# Patient Record
Sex: Male | Born: 1985 | Race: Black or African American | Hispanic: No | Marital: Married | Smoking: Never smoker
Health system: Southern US, Community
[De-identification: ages and names within clinical notes are randomized; demographics above are authoritative.]

## PROBLEM LIST (undated history)

## (undated) DIAGNOSIS — R569 Unspecified convulsions: Secondary | ICD-10-CM

## (undated) DIAGNOSIS — I10 Essential (primary) hypertension: Secondary | ICD-10-CM

## (undated) DIAGNOSIS — K219 Gastro-esophageal reflux disease without esophagitis: Secondary | ICD-10-CM

## (undated) DIAGNOSIS — F419 Anxiety disorder, unspecified: Secondary | ICD-10-CM

## (undated) HISTORY — PX: COLONOSCOPY: SHX174

## (undated) HISTORY — PX: WISDOM TOOTH EXTRACTION: SHX21

## (undated) HISTORY — PX: NO PAST SURGERIES: SHX2092

## (undated) HISTORY — DX: Anxiety disorder, unspecified: F41.9

## (undated) HISTORY — DX: Gastro-esophageal reflux disease without esophagitis: K21.9

---

## 2009-08-06 ENCOUNTER — Emergency Department (HOSPITAL_COMMUNITY): Admission: EM | Admit: 2009-08-06 | Discharge: 2009-08-06 | Payer: Self-pay | Admitting: Emergency Medicine

## 2015-07-13 ENCOUNTER — Emergency Department (HOSPITAL_COMMUNITY)
Admission: EM | Admit: 2015-07-13 | Discharge: 2015-07-13 | Disposition: A | Discharge status: Home / Self Care | Payer: 59 | Attending: Emergency Medicine | Admitting: Emergency Medicine

## 2015-07-13 ENCOUNTER — Encounter (HOSPITAL_COMMUNITY): Payer: Self-pay | Admitting: Cardiology

## 2015-07-13 DIAGNOSIS — Z79899 Other long term (current) drug therapy: Secondary | ICD-10-CM | POA: Insufficient documentation

## 2015-07-13 DIAGNOSIS — I10 Essential (primary) hypertension: Secondary | ICD-10-CM | POA: Insufficient documentation

## 2015-07-13 HISTORY — DX: Unspecified convulsions: R56.9

## 2015-07-13 LAB — BASIC METABOLIC PANEL
Anion gap: 7 (ref 5–15)
BUN: 14 mg/dL (ref 6–20)
CALCIUM: 9.8 mg/dL (ref 8.9–10.3)
CO2: 28 mmol/L (ref 22–32)
CREATININE: 1.1 mg/dL (ref 0.61–1.24)
Chloride: 103 mmol/L (ref 101–111)
GFR calc Af Amer: >60 mL/min (ref >60)
GLUCOSE: 89 mg/dL (ref 65–99)
Potassium: 4.4 mmol/L (ref 3.5–5.1)
Sodium: 138 mmol/L (ref 135–145)

## 2015-07-13 LAB — URINALYSIS, ROUTINE W REFLEX MICROSCOPIC
Bilirubin Urine: NEGATIVE
Glucose, UA: NEGATIVE mg/dL
Hgb urine dipstick: NEGATIVE
Ketones, ur: NEGATIVE mg/dL
Leukocytes, UA: NEGATIVE
Nitrite: NEGATIVE
Protein, ur: NEGATIVE mg/dL
Specific Gravity, Urine: 1.008 (ref 1.005–1.030)
Urobilinogen, UA: 1 mg/dL (ref 0.0–1.0)
pH: 6.5 (ref 5.0–8.0)

## 2015-07-13 LAB — CBC
HCT: 41.5 % (ref 39.0–52.0)
Hemoglobin: 13.3 g/dL (ref 13.0–17.0)
MCH: 27.4 pg (ref 26.0–34.0)
MCHC: 32 g/dL (ref 30.0–36.0)
MCV: 85.6 fL (ref 78.0–100.0)
Platelets: 226 10*3/uL (ref 150–400)
RBC: 4.85 MIL/uL (ref 4.22–5.81)
RDW: 12.9 % (ref 11.5–15.5)
WBC: 4.5 10*3/uL (ref 4.0–10.5)

## 2015-07-13 LAB — I-STAT TROPONIN, ED: TROPONIN I, POC: 0 ng/mL (ref 0.00–0.08)

## 2015-07-13 MED ORDER — HYDRALAZINE HCL 20 MG/ML IJ SOLN
5.0000 mg | Freq: Once | INTRAMUSCULAR | Status: AC
  Administered 2015-07-13: 5 mg via INTRAVENOUS
  Filled 2015-07-13: qty 1

## 2015-07-13 NOTE — Discharge Instructions (Signed)
Hypertension Hypertension, commonly called high blood pressure, is when the force of blood pumping through your arteries is too strong. Your arteries are the blood vessels that carry blood from your heart throughout your body. A blood pressure reading consists of a higher number over a lower number, such as 110/72. The higher number (systolic) is the pressure inside your arteries when your heart pumps. The lower number (diastolic) is the pressure inside your arteries when your heart relaxes. Ideally you want your blood pressure below 120/80. Hypertension forces your heart to work harder to pump blood. Your arteries may become narrow or stiff. Having untreated or uncontrolled hypertension can cause heart attack, stroke, kidney disease, and other problems. RISK FACTORS Some risk factors for high blood pressure are controllable. Others are not.  Risk factors you cannot control include:   Race. You may be at higher risk if you are African American.  Age. Risk increases with age.  Gender. Men are at higher risk than women before age 45 years. After age 65, women are at higher risk than men. Risk factors you can control include:  Not getting enough exercise or physical activity.  Being overweight.  Getting too much fat, sugar, calories, or salt in your diet.  Drinking too much alcohol. SIGNS AND SYMPTOMS Hypertension does not usually cause signs or symptoms. Extremely high blood pressure (hypertensive crisis) may cause headache, anxiety, shortness of breath, and nosebleed. DIAGNOSIS To check if you have hypertension, your health care provider will measure your blood pressure while you are seated, with your arm held at the level of your heart. It should be measured at least twice using the same arm. Certain conditions can cause a difference in blood pressure between your right and left arms. A blood pressure reading that is higher than normal on one occasion does not mean that you need treatment. If  it is not clear whether you have high blood pressure, you may be asked to return on a different day to have your blood pressure checked again. Or, you may be asked to monitor your blood pressure at home for 1 or more weeks. TREATMENT Treating high blood pressure includes making lifestyle changes and possibly taking medicine. Living a healthy lifestyle can help lower high blood pressure. You may need to change some of your habits. Lifestyle changes may include:  Following the DASH diet. This diet is high in fruits, vegetables, and whole grains. It is low in salt, red meat, and added sugars.  Keep your sodium intake below 2,300 mg per day.  Getting at least 30-45 minutes of aerobic exercise at least 4 times per week.  Losing weight if necessary.  Not smoking.  Limiting alcoholic beverages.  Learning ways to reduce stress. Your health care provider may prescribe medicine if lifestyle changes are not enough to get your blood pressure under control, and if one of the following is true:  You are 18-59 years of age and your systolic blood pressure is above 140.  You are 60 years of age or older, and your systolic blood pressure is above 150.  Your diastolic blood pressure is above 90.  You have diabetes, and your systolic blood pressure is over 140 or your diastolic blood pressure is over 90.  You have kidney disease and your blood pressure is above 140/90.  You have heart disease and your blood pressure is above 140/90. Your personal target blood pressure may vary depending on your medical conditions, your age, and other factors. HOME CARE INSTRUCTIONS    Have your blood pressure rechecked as directed by your health care provider.   Take medicines only as directed by your health care provider. Follow the directions carefully. Blood pressure medicines must be taken as prescribed. The medicine does not work as well when you skip doses. Skipping doses also puts you at risk for  problems.  Do not smoke.   Monitor your blood pressure at home as directed by your health care provider. SEEK MEDICAL CARE IF:   You think you are having a reaction to medicines taken.  You have recurrent headaches or feel dizzy.  You have swelling in your ankles.  You have trouble with your vision. SEEK IMMEDIATE MEDICAL CARE IF:  You develop a severe headache or confusion.  You have unusual weakness, numbness, or feel faint.  You have severe chest or abdominal pain.  You vomit repeatedly.  You have trouble breathing. MAKE SURE YOU:   Understand these instructions.  Will watch your condition.  Will get help right away if you are not doing well or get worse.   This information is not intended to replace advice given to you by your health care provider. Make sure you discuss any questions you have with your health care provider.   Document Released: 09/08/2005 Document Revised: 01/23/2015 Document Reviewed: 07/01/2013 Elsevier Interactive Patient Education 2016 [employer]  Heart Disease Prevention Heart disease is a leading cause of death. There are many things you can do to help prevent heart disease. BE PHYSICALLY ACTIVE Physical activity is good for your heart. It helps control your blood pressure, cholesterol levels, and weight. Try to be physically active every day. Ask your health care provider what activities are best for you.  BE A HEALTHY WEIGHT Extra weight can strain your heart and affect your blood pressure and cholesterol levels. Lose weight with diet and exercise if recommended by your health care provider. EAT HEART-HEALTHY FOODS Follow a healthy eating plan as recommended by your health care provider or dietitian. Heart-healthy foods include:   High-fiber foods. These include oat bran, oatmeal, and whole-grain breads and cereals.  Fruits and vegetables. Avoid:  Alcohol.  Fried foods.  Foods high in saturated fat. These include meats,  butter, whole dairy products, shortening, and coconut or palm oil.  Salty foods. These include canned food, luncheon meat, salty snacks, and fast food. KEEP YOUR CHOLESTEROL LEVELS UNDER CONTROL Cholesterol is a substance that is used for many important functions. When your cholesterol levels are high, cholesterol can stick to the insides of your blood vessels, making them narrow or clog. This can lead to chest pain (angina) and a heart attack.  Keep your cholesterol levels under control as recommended by your health care provider. Have your cholesterol checked at least once a year. Target cholesterol levels (in mg/dL) for most people are:   Total cholesterol below 200.  LDL cholesterol below 100.  HDL cholesterol above 40 in men and above 50 in women.  Triglycerides below 150. KEEP YOUR BLOOD PRESSURE UNDER CONTROL Having high blood pressure (hypertension) puts you at risk for stroke and other forms of heart disease. Keep your blood pressure under control as recommended by your health care provider. Ask your health care provider if you need treatment to lower your blood pressure. If you are 58-59 years of age, have your blood pressure checked every 3-5 years. If you are 10 years of age or older, have your blood pressure checked every year. DO NOT USE TOBACCO PRODUCTS Tobacco smoke can damage  your heart and blood vessels. Do not use any tobacco products including cigarettes, chewing tobacco, or electronic cigarettes. If you need help quitting, ask your health care provider. TAKE MEDICINES AS DIRECTED Take medicines only as directed by your health care provider. Ask your health care provider whether you should take an aspirin every day. Taking aspirin can help reduce your risk of heart disease and stroke.  FOR MORE INFORMATION  To find out more about heart disease, visit the American Heart Association's website at www.americanheart.org   This information is not intended to replace advice given  to you by your health care provider. Make sure you discuss any questions you have with your health care provider.  Follow up with your primary care provider in one week for reevaluation of high blood pressure. Take blood pressure medications as prescribed. Return to the emergency department if you experience worsening of your symptoms, headache, difficulty urinating, blurry vision.

## 2015-07-13 NOTE — ED Notes (Signed)
Reports he started to feel "funny" at work and [facility]. Reports a tightness in his jaw.

## 2015-07-14 NOTE — ED Provider Notes (Signed)
CSN: [id number]     Arrival date & time 07/13/15  1316 History   First MD Initiated Contact with Patient 07/13/15 1449     Chief Complaint  Patient presents with  . Hypertension     (Consider location/radiation/quality/duration/timing/severity/associated sxs/prior Treatment) HPI   Harold Ayers is a 29 y.o M with a pmhx of HTN who presents to the emergency department today to be evaluated for high blood pressure. Patient states that earlier today she experienced a headache and tightness in history also would see the urgent care. While he was at urgent care his blood pressure was elevated "over 200". Patient states he was given a dose of blood pressure medication while he was there and then sent to the emergency department for further evaluation. Patient states that he has a history of high blood pressure for which he took medication for several years ago. Patient states at that time he weighed about 230 pounds. Patient lost 50 pounds and his blood pressure normalized so he stopped taking his blood pressure medication. However patient has since gained the weight back and is currently not on medication. In the emergency department, upon presentation patient's blood pressure is 183/127. Denies blurry vision, vomiting, dysuria, back pain, abdominal pain, chest pain, shortness of breath.   Past Medical History  Diagnosis Date  . Seizures (HCC)    History reviewed. No pertinent past surgical history. History reviewed. No pertinent family history. Social History  Substance Use Topics  . Smoking status: Never Smoker   . Smokeless tobacco: None  . Alcohol Use: Yes    Review of Systems  All other systems reviewed and are negative.     Allergies  Review of patient's allergies indicates no known allergies.  Home Medications   Prior to Admission medications   Medication Sig Start Date End Date Taking? Authorizing Provider  lisinopril-hydrochlorothiazide (PRINZIDE,ZESTORETIC) 10-12.5 MG  tablet Take 1 tablet by mouth daily.   Yes Historical Provider, MD   BP 168/105 mmHg  Pulse 77  Temp(Src) 98.6 F (37 C) (Oral)  Resp 18  Ht 6\' 3"  (1.905 m)  Wt 222 lb 8 oz (100.925 kg)  BMI 27.81 kg/m2  SpO2 100% Physical Exam  Constitutional: He is oriented to person, place, and time. He appears well-developed and well-nourished. No distress.  HENT:  Head: Normocephalic and atraumatic.  Mouth/Throat: Oropharynx is clear and moist. No oropharyngeal exudate.  No trismus.  Eyes: Conjunctivae and EOM are normal. Pupils are equal, round, and reactive to light. Right eye exhibits no discharge. Left eye exhibits no discharge. No scleral icterus.  Neck: Normal range of motion. Neck supple.  Cardiovascular: Normal rate, regular rhythm, normal heart sounds and intact distal pulses.  Exam reveals no gallop and no friction rub.   No murmur heard. Pulmonary/Chest: Effort normal and breath sounds normal. No respiratory distress. He has no wheezes. He has no rales. He exhibits no tenderness.  Abdominal: Soft. Bowel sounds are normal. He exhibits no distension and no mass. There is no tenderness. There is no rebound and no guarding.  Musculoskeletal: Normal range of motion. He exhibits no edema.  Lymphadenopathy:    He has no cervical adenopathy.  Neurological: He is alert and oriented to person, place, and time.  Strength 5/5 throughout. No sensory deficits.  NO gait abnormality  Skin: Skin is warm and dry. No rash noted. He is not diaphoretic. No erythema. No pallor.  Psychiatric: He has a normal mood and affect. His behavior is normal.  Nursing note  and vitals reviewed.   ED Course  Procedures (including critical care time) Labs Review Labs Reviewed  BASIC METABOLIC PANEL  CBC  URINALYSIS, ROUTINE W REFLEX MICROSCOPIC (NOT AT [facility])  I-STAT TROPOININ, ED    Imaging Review No results found. I have personally reviewed and evaluated these images and lab results as part of my medical  decision-making.   EKG Interpretation   Date/Time:  Friday July 13 2015 13:27:46 EDT Ventricular Rate:  82 PR Interval:  148 QRS Duration: 76 QT Interval:  378 QTC Calculation: 441 R Axis:   65 Text Interpretation:  Normal sinus rhythm Biatrial enlargement Pulmonary  disease pattern Abnormal ECG Confirmed by ALLEN  MD, ANTHONY ([id number]) on  07/13/2015 2:50:32 PM      MDM   Final diagnoses:  Essential hypertension    Patient with history of hypertension, currently not on blood pressure medication presents to the emergency room from urgent care to be evaluated for elevated blood pressure. Patient was given dose of unknown blood pressure medication while at urgent care. Patient complaining of headache and jaw tightening earlier today. BP in the emergency department is 183/127.  Patient given 10 mg IV hydralazine. Patient reports symptomatic improvement. No longer has a headache or tightness in his jaw. BP is now 168/105.  All labs within normal limits. No evidence of end organ damage. No proteinuria. No kidney dysfunction.  Patient was given prescription of Zestoretic at urgent care. Encourage patient to be compliant with his medications and to follow-up with his primary care provider in 48 hours. Discussed treatment plan with patient who is agreeable. Return precautions outlined in patient discharge discharged.  Patient was discussed with and seen by Dr. Freida Busman who agrees with the treatment plan.      Harold Kinsman [place], PA-C 07/14/15 8119  Lorre Nick, MD 07/24/15 [phone]

## 2015-10-24 ENCOUNTER — Emergency Department (HOSPITAL_COMMUNITY)
Admission: EM | Admit: 2015-10-24 | Discharge: 2015-10-25 | Disposition: A | Discharge status: Home / Self Care | Payer: 59 | Attending: Emergency Medicine | Admitting: Emergency Medicine

## 2015-10-24 ENCOUNTER — Emergency Department (HOSPITAL_COMMUNITY): Payer: 59

## 2015-10-24 ENCOUNTER — Encounter (HOSPITAL_COMMUNITY): Payer: Self-pay | Admitting: Emergency Medicine

## 2015-10-24 DIAGNOSIS — R0789 Other chest pain: Secondary | ICD-10-CM | POA: Diagnosis not present

## 2015-10-24 DIAGNOSIS — R079 Chest pain, unspecified: Secondary | ICD-10-CM | POA: Diagnosis present

## 2015-10-24 DIAGNOSIS — Z79899 Other long term (current) drug therapy: Secondary | ICD-10-CM | POA: Diagnosis not present

## 2015-10-24 DIAGNOSIS — D649 Anemia, unspecified: Secondary | ICD-10-CM | POA: Diagnosis not present

## 2015-10-24 DIAGNOSIS — R002 Palpitations: Secondary | ICD-10-CM | POA: Diagnosis not present

## 2015-10-24 DIAGNOSIS — I10 Essential (primary) hypertension: Secondary | ICD-10-CM | POA: Diagnosis not present

## 2015-10-24 DIAGNOSIS — R030 Elevated blood-pressure reading, without diagnosis of hypertension: Secondary | ICD-10-CM

## 2015-10-24 DIAGNOSIS — IMO0001 Reserved for inherently not codable concepts without codable children: Secondary | ICD-10-CM

## 2015-10-24 HISTORY — DX: Essential (primary) hypertension: I10

## 2015-10-24 LAB — I-STAT TROPONIN, ED: TROPONIN I, POC: 0 ng/mL (ref 0.00–0.08)

## 2015-10-24 LAB — CBC
HEMATOCRIT: 38.3 % — AB (ref 39.0–52.0)
Hemoglobin: 12.7 g/dL — ABNORMAL LOW (ref 13.0–17.0)
MCH: 28.3 pg (ref 26.0–34.0)
MCHC: 33.2 g/dL (ref 30.0–36.0)
MCV: 85.3 fL (ref 78.0–100.0)
PLATELETS: 231 10*3/uL (ref 150–400)
RBC: 4.49 MIL/uL (ref 4.22–5.81)
RDW: 13.6 % (ref 11.5–15.5)
WBC: 6.8 10*3/uL (ref 4.0–10.5)

## 2015-10-24 NOTE — ED Notes (Signed)
Pt has had chest pain x 2 weeks intermittently, tonight while driving pain became worse like something was "squeezing" his heart. Also felt palpitations. Denies dizziness, shortness of breath, n/v. Recently diagnosed with HTN. Initial BP-180/120, pain 7/10. Given 1 nitro, pain 2/10, BP-150/100.  Pt had 324 asa en route

## 2015-10-24 NOTE — ED Notes (Signed)
Patient transported to X-ray 

## 2015-10-24 NOTE — ED Provider Notes (Signed)
CSN: [id number]     Arrival date & time 10/24/15  2019 History   First MD Initiated Contact with Patient 10/24/15 2215     Chief Complaint  Patient presents with  . Chest Pain     (Consider location/radiation/quality/duration/timing/severity/associated sxs/prior Treatment) Patient is a 30 y.o. male presenting with chest pain. The history is provided by the patient and medical records. No language interpreter was used.  Chest Pain Associated symptoms: palpitations   Associated symptoms: no abdominal pain, no back pain, no cough, no dizziness, no fever, no headache, no nausea, no shortness of breath, not vomiting and no weakness    Geovanny Sartin is a 30 y.o. male  with a PMH of HTN, seizures who presents to the Emergency Department complaining of intermittent sharp chest pain. Patient states that he has had intermittent chest pain over the last 4 months, however today pain was much worse than in the past. Pt. Experienced associated heart palpitations which lasted about 10-15 minutes and have since resolved. Not exacerbated by positional changes or exertion. No alleviating or aggravating factors noted. Per EMS, given 1 nitro and 324 asa en route. Denies numbness/tingling, sob, n/v. No chest pain at this time. Pt. With hx of HTN and taking meds.   Risk factors: not a smoker, + HTN, no HLD or heart dz, Father died of heart attack in his 53's  Past Medical History  Diagnosis Date  . Seizures (HCC)   . Hypertension    History reviewed. No pertinent past surgical history. No family history on file. Social History  Substance Use Topics  . Smoking status: Never Smoker   . Smokeless tobacco: None  . Alcohol Use: Yes    Review of Systems  Constitutional: Negative for fever and chills.  HENT: Negative for congestion and sore throat.   Eyes: Negative for visual disturbance.  Respiratory: Negative for cough, shortness of breath and wheezing.   Cardiovascular: Positive for chest pain and  palpitations. Negative for leg swelling.  Gastrointestinal: Negative for nausea, vomiting and abdominal pain.  Musculoskeletal: Negative for back pain and neck pain.  Skin: Negative for rash.  Allergic/Immunologic: Negative for immunocompromised state.  Neurological: Negative for dizziness, weakness and headaches.      Allergies  Review of patient's allergies indicates no known allergies.  Home Medications   Prior to Admission medications   Medication Sig Start Date End Date Taking? Authorizing Provider  Coenzyme Q10 (COQ10) 50 MG CAPS Take 50 mg by mouth daily.   Yes Historical Provider, MD  lisinopril-hydrochlorothiazide (PRINZIDE,ZESTORETIC) 10-12.5 MG tablet Take 1 tablet by mouth daily.   Yes Historical Provider, MD  Omega-3 Fatty Acids (FISH OIL) 1000 MG CAPS Take 3,000 mg by mouth daily.   Yes Historical Provider, MD  pantoprazole (PROTONIX) 20 MG tablet Take 1 tablet (20 mg total) by mouth daily. 10/25/15   Jaime Pilcher Ward, PA-C   BP 147/97 mmHg  Pulse 72  Resp 16  SpO2 100% Physical Exam  Constitutional: He is oriented to person, place, and time. He appears well-developed and well-nourished.  Alert and in no acute distress  HENT:  Head: Normocephalic and atraumatic.  Cardiovascular: Normal rate, regular rhythm, normal heart sounds and intact distal pulses.  Exam reveals no gallop and no friction rub.   No murmur heard. Pulmonary/Chest: Effort normal and breath sounds normal. No respiratory distress. He has no wheezes. He has no rales. He exhibits tenderness.    Abdominal: Soft. Bowel sounds are normal. He exhibits no distension and  no mass. There is no tenderness. There is no rebound and no guarding.  Musculoskeletal: He exhibits no edema.  Neurological: He is alert and oriented to person, place, and time.  Skin: Skin is warm and dry. No rash noted.  Psychiatric: He has a normal mood and affect. His behavior is normal. Judgment and thought content normal.  Nursing  note and vitals reviewed.   ED Course  Procedures (including critical care time) Labs Review Labs Reviewed  CBC - Abnormal; Notable for the following:    Hemoglobin 12.7 (*)    HCT 38.3 (*)    All other components within normal limits  COMPREHENSIVE METABOLIC PANEL - Abnormal; Notable for the following:    Chloride 99 (*)    AST 44 (*)    Alkaline Phosphatase 36 (*)    All other components within normal limits  I-STAT TROPOININ, ED    Imaging Review Dg Chest 2 View  10/24/2015  CLINICAL DATA:  Chest pain and shortness of breath tonight. EXAM: CHEST  2 VIEW COMPARISON:  None. FINDINGS: The cardiomediastinal contours are normal. The lungs are clear. Pulmonary vasculature is normal. No consolidation, pleural effusion, or pneumothorax. No acute osseous abnormalities are seen. IMPRESSION: No acute pulmonary process. Electronically Signed   By: Rubye Oaks M.D.   On: 10/24/2015 23:12   I have personally reviewed and evaluated these images and lab results as part of my medical decision-making.   EKG Interpretation None      MDM   Final diagnoses:  Atypical chest pain  Anemia, unspecified anemia type  Elevated blood pressure   Debera Lat presents with atypical chest pain that is tender to the touch as depicted in image above. No sob. PERC negative. Low risk heart score. CXR with no acute cardiopulm dz. Trop, CBC, and CMP reassuring. EKG reviewed and reassuring. Not likely to be of cardiopulm. Etiology. Will give trial of GI cocktail.   Patient states much improvement after GI cocktail. Will dc to home with rx for protonix. PCP follow up recommended (resource guide given). Pt. Also told to follow up for elevated BP and anemia. Return precautions and home care instructions given. All questions answered.   [facility] Ward, PA-C 10/25/15 0040  Dione Booze, MD 10/25/15 1455

## 2015-10-25 LAB — COMPREHENSIVE METABOLIC PANEL
ALK PHOS: 36 U/L — AB (ref 38–126)
ALT: 30 U/L (ref 17–63)
AST: 44 U/L — ABNORMAL HIGH (ref 15–41)
Albumin: 4.2 g/dL (ref 3.5–5.0)
Anion gap: 11 (ref 5–15)
BUN: 15 mg/dL (ref 6–20)
CALCIUM: 9.9 mg/dL (ref 8.9–10.3)
CO2: 28 mmol/L (ref 22–32)
CREATININE: 1.22 mg/dL (ref 0.61–1.24)
Chloride: 99 mmol/L — ABNORMAL LOW (ref 101–111)
Glucose, Bld: 93 mg/dL (ref 65–99)
Potassium: 4.5 mmol/L (ref 3.5–5.1)
Sodium: 138 mmol/L (ref 135–145)
TOTAL PROTEIN: 7.4 g/dL (ref 6.5–8.1)
Total Bilirubin: 0.8 mg/dL (ref 0.3–1.2)

## 2015-10-25 MED ORDER — PANTOPRAZOLE SODIUM 20 MG PO TBEC: 20.0000 mg | DELAYED_RELEASE_TABLET | Freq: Every day | ORAL | Status: DC

## 2015-10-25 MED ORDER — GI COCKTAIL ~~LOC~~
30.0000 mL | Freq: Once | ORAL | Status: AC
  Administered 2015-10-25: 30 mL via ORAL
  Filled 2015-10-25: qty 30

## 2015-10-25 NOTE — Discharge Instructions (Signed)
Take protonix as directed, continue usual home medications Please follow up with your primary doctor in 3-5 days for discussion of your diagnoses and further evaluation after today's visit; if you do not have a primary care doctor use the resource guide provided to find one. You can also call the number listed on your insurance card for help finding a primary doctor near you that accepts your insurance; Please return to the ER for new or worsening symptoms, any additional concerns.    Emergency Department Resource Guide 1) Find a Doctor and Pay Out of Pocket Although you won't have to find out who is covered by your insurance plan, it is a good idea to ask around and get recommendations. You will then need to call the office and see if the doctor you have chosen will accept you as a new patient and what types of options they offer for patients who are self-pay. Some doctors offer discounts or will set up payment plans for their patients who do not have insurance, but you will need to ask so you aren't surprised when you get to your appointment.  2) Contact Your Local Health Department Not all health departments have doctors that can see patients for sick visits, but many do, so it is worth a call to see if yours does. If you don't know where your local health department is, you can check in your phone book. The CDC also has a tool to help you locate your state's health department, and many state websites also have listings of all of their local health departments.  3) Find a Walk-in Clinic If your illness is not likely to be very severe or complicated, you may want to try a walk in clinic. These are popping up all over the country in pharmacies, drugstores, and shopping centers. They're usually staffed by nurse practitioners or physician assistants that have been trained to treat common illnesses and complaints. They're usually fairly quick and inexpensive. However, if you have serious medical issues or  chronic medical problems, these are probably not your best option.  No Primary Care Doctor: - Call [facility] at  [phone] - they can help you locate a primary care doctor that  accepts your insurance, provides certain services, etc. - Physician Referral Service- [phone]  Chronic Pain Problems: Organization         Address  Phone   Notes  [facility]  [phone] Patients need to be referred by their primary care doctor.   Medication Assistance: Organization         Address  Phone   Notes  [facility] [address] [place], [place] [address] [phone] --Must be a resident of [facility] -- Must have NO insurance coverage whatsoever (no Medicaid/ Medicare, etc.) -- The pt. MUST have a primary care doctor that directs their care regularly and follows them in the community   [facility]  [phone]   [employer]  [phone]    Agencies that provide inexpensive medical care: Organization         Address  Phone   Notes  [facility]  [phone]   [facility]    [phone]   [facility] [address] [place], [place] [address] [phone]   [facility] [address] [place] [place] [phone]   [facility]    [phone]   [facility]    [phone]   [facility]  [address], [place] Phone:  [phone], Fax:  [phone] Hours of Operation:  9 am - 6 pm, M-F.  Also accepts Medicaid/Medicare and self-pay.  [facility]  [address], [place] Phone: [phone], Fax: [phone]. Hours of Operation:  8:30 am - 5:30 pm, M-F.  Also accepts Medicaid and self-pay.  [facility] [place] [address] [place] Phone: [phone]   [facility] [address] Natasha Bence [place], [place]  [phone], Ext. [phone] Mondays & Thursdays: 7-9 AM.  First 15 patients are seen on a first come, first serve basis.    Medicaid-accepting [facility] Providers:  Organization         Address  Phone   Notes  [facility] [address], [place] [phone] Also accepts self-pay patients.  [facility] [address] Laurell Josephs [place], [place]  [phone]   [facility] [address], [place] [phone]   [facility] [address] [place] [phone]   Renaye Rakers [address], [place]   [phone] Only accepts [place] patients after they have their name applied to their card.   Self-Pay (no insurance) in [facility]:  Organization         Address  Phone   Notes  Sickle Cell Patients, [facility] [address] [place], [place] [phone]   [facility] [address] [place], [place] [phone]   [facility]  [address], [place] [phone]   [facility]/Dr. Osei-Bonsu  [address], [place] or [address], [place] [phone] Phone number for both [place] and [place] locations is the same.  [facility] [address] [place] [phone]   [facility] [address] [place] or [address] [phone]   [facility] [address], [place] [phone], phone; [phone], fax Sees patients 1st and 3rd Saturday of every month.  Must not qualify for public or private insurance (i.e. Medicaid, Medicare, [place] [facility] Choice, Veterans' Benefits)  Household income should be no more than 200% of the poverty level The clinic cannot treat you if you are pregnant or think you are pregnant  Sexually transmitted  diseases are not treated at the clinic.

## 2016-06-18 ENCOUNTER — Emergency Department (HOSPITAL_COMMUNITY): Payer: 59

## 2016-06-18 ENCOUNTER — Emergency Department (HOSPITAL_COMMUNITY)
Admission: EM | Admit: 2016-06-18 | Discharge: 2016-06-19 | Disposition: A | Discharge status: Home / Self Care | Payer: 59 | Attending: Emergency Medicine | Admitting: Emergency Medicine

## 2016-06-18 ENCOUNTER — Encounter (HOSPITAL_COMMUNITY): Payer: Self-pay

## 2016-06-18 DIAGNOSIS — I10 Essential (primary) hypertension: Secondary | ICD-10-CM | POA: Insufficient documentation

## 2016-06-18 DIAGNOSIS — R079 Chest pain, unspecified: Secondary | ICD-10-CM | POA: Diagnosis not present

## 2016-06-18 DIAGNOSIS — Z79899 Other long term (current) drug therapy: Secondary | ICD-10-CM | POA: Diagnosis not present

## 2016-06-18 LAB — CBC
HCT: 41.7 % (ref 39.0–52.0)
Hemoglobin: 13.5 g/dL (ref 13.0–17.0)
MCH: 28 pg (ref 26.0–34.0)
MCHC: 32.4 g/dL (ref 30.0–36.0)
MCV: 86.3 fL (ref 78.0–100.0)
Platelets: 223 10*3/uL (ref 150–400)
RBC: 4.83 MIL/uL (ref 4.22–5.81)
RDW: 13.5 % (ref 11.5–15.5)
WBC: 6.4 10*3/uL (ref 4.0–10.5)

## 2016-06-18 LAB — BASIC METABOLIC PANEL
Anion gap: 9 (ref 5–15)
BUN: 17 mg/dL (ref 6–20)
CALCIUM: 9.8 mg/dL (ref 8.9–10.3)
CHLORIDE: 101 mmol/L (ref 101–111)
CO2: 24 mmol/L (ref 22–32)
CREATININE: 1.2 mg/dL (ref 0.61–1.24)
GFR calc Af Amer: >60 mL/min (ref >60)
GFR calc non Af Amer: >60 mL/min (ref >60)
Glucose, Bld: 106 mg/dL — ABNORMAL HIGH (ref 65–99)
Potassium: 4.1 mmol/L (ref 3.5–5.1)
SODIUM: 134 mmol/L — AB (ref 135–145)

## 2016-06-18 LAB — I-STAT TROPONIN, ED: Troponin i, poc: 0.01 ng/mL (ref 0.00–0.08)

## 2016-06-18 MED ORDER — GI COCKTAIL ~~LOC~~
30.0000 mL | Freq: Once | ORAL | Status: AC
  Administered 2016-06-18: 30 mL via ORAL
  Filled 2016-06-18: qty 30

## 2016-06-18 MED ORDER — ASPIRIN 81 MG PO CHEW
324.0000 mg | CHEWABLE_TABLET | Freq: Once | ORAL | Status: AC
  Administered 2016-06-18: 324 mg via ORAL
  Filled 2016-06-18: qty 4

## 2016-06-18 MED ORDER — LISINOPRIL 10 MG PO TABS
10.0000 mg | ORAL_TABLET | Freq: Once | ORAL | Status: AC
  Administered 2016-06-18: 10 mg via ORAL
  Filled 2016-06-18: qty 1

## 2016-06-18 NOTE — ED Notes (Signed)
Pt ambulatory to D35 with steady gait noted

## 2016-06-18 NOTE — ED Triage Notes (Signed)
Pt states that he has had CP on and off all day, pain is L sided, no radiation, denies n/v/sob, denies heavy lifting.

## 2016-06-18 NOTE — ED Notes (Signed)
PA at bedside.

## 2016-06-19 LAB — I-STAT TROPONIN, ED: Troponin i, poc: 0 ng/mL (ref 0.00–0.08)

## 2016-06-19 MED ORDER — OMEPRAZOLE 20 MG PO CPDR: 20.0000 mg | DELAYED_RELEASE_CAPSULE | Freq: Every day | ORAL | 0 refills | Status: DC

## 2016-06-19 NOTE — ED Notes (Signed)
Repeat troponin drawn, pt states pain decreased to 2/10

## 2016-06-19 NOTE — Discharge Instructions (Signed)
Your labs were normal and reassuring. I suspect some of your chest pain might be due to reflux. I gave you a prescription to try to help with reflux. Follow up with your primary care provider as soon as possible. Remember to take your blood pressure medicine every day. Return to the ER for new or worsening symptoms.

## 2016-06-19 NOTE — ED Provider Notes (Signed)
[facility] DEPT Provider Note   CSN: [id number] Arrival date & time: 06/18/16  2019  History   Chief Complaint Chief Complaint  Patient presents with  . Chest Pain   HPI  Harold Ayers is an 30 y.o. male with history of HTN and seizures who presents to the ED for evaluation of chest pain. He states he was in his usual state of health until today when he was sitting at work this morning and started experiencing left sided chest pain. He states the pain does not radiate. States it is intermittent. States the episodes last minutes. Denies SOB. Denies diaphoresis. He has not tried anything to alleviate his symptoms. States maybe the pain is worse when laying down. It is not worse with exertion. Denies leg pain or swelling. He states he was seen int he ED a few months ago for similar symptoms with negative workup. Rates his pain 7/10 currently. Denies cigarette use. Denies fam hx of cardiac dz.  Pt was hypertensive on arrival to the ED. He states he typically takes lisinopril 10mg  PO each morning but he forgot to take his medicine today.  Past Medical History:  Diagnosis Date  . Hypertension   . Seizures (HCC)     There are no active problems to display for this patient.   History reviewed. No pertinent surgical history.     Home Medications    Prior to Admission medications   Medication Sig Start Date End Date Taking? Authorizing Provider  Coenzyme Q10 (COQ10) 50 MG CAPS Take 50 mg by mouth daily.    Historical Provider, MD  lisinopril-hydrochlorothiazide (PRINZIDE,ZESTORETIC) 10-12.5 MG tablet Take 1 tablet by mouth daily.    Historical Provider, MD  Omega-3 Fatty Acids (FISH OIL) 1000 MG CAPS Take 3,000 mg by mouth daily.    Historical Provider, MD  pantoprazole (PROTONIX) 20 MG tablet Take 1 tablet (20 mg total) by mouth daily. 10/25/15   Chase Picket Ward, PA-C    Family History No family history on file.  Social History Social History  Substance Use Topics  . Smoking  status: Never Smoker  . Smokeless tobacco: Never Used  . Alcohol use Yes     Allergies   Review of patient's allergies indicates no known allergies.   Review of Systems Review of Systems 10 Systems reviewed and are negative for acute change except as noted in the HPI.  Physical Exam Updated Vital Signs BP (!) 162/122   Pulse 65   Temp 98.2 F (36.8 C) (Oral)   Resp 17   Ht 6\' 3"  (1.905 m)   Wt 104.3 kg   SpO2 99%   BMI 28.75 kg/m   Physical Exam  Constitutional: He is oriented to person, place, and time.  HENT:  Right Ear: External ear normal.  Left Ear: External ear normal.  Nose: Nose normal.  Mouth/Throat: Oropharynx is clear and moist. No oropharyngeal exudate.  Eyes: Conjunctivae are normal.  Neck: Neck supple.  Cardiovascular: Normal rate, regular rhythm, normal heart sounds and intact distal pulses.   Pulmonary/Chest: Effort normal and breath sounds normal. No respiratory distress. He has no wheezes.  Abdominal: Soft. Bowel sounds are normal. He exhibits no distension. There is no tenderness. There is no rebound and no guarding.  Musculoskeletal: He exhibits no edema.  No calf tenderness or lower extremity edema  Lymphadenopathy:    He has no cervical adenopathy.  Neurological: He is alert and oriented to person, place, and time. No cranial nerve deficit.  Skin: Skin  is warm and dry.  Psychiatric: He has a normal mood and affect.  Nursing note and vitals reviewed.    ED Treatments / Results  Labs (all labs ordered are listed, but only abnormal results are displayed) Labs Reviewed  BASIC METABOLIC PANEL - Abnormal; Notable for the following:       Result Value   Sodium 134 (*)    Glucose, Bld 106 (*)    All other components within normal limits  CBC  I-STAT TROPOININ, ED  Rosezena SensorI-STAT TROPOININ, ED    EKG  EKG Interpretation None       Radiology Dg Chest 2 View  Result Date: 06/18/2016 CLINICAL DATA:  [age] year old male with chest pain under the  left breast with some pain radiating to the left jaw. EXAM: CHEST  2 VIEW COMPARISON:  Chest x-ray 10/24/2015. FINDINGS: Lung volumes are normal. No consolidative airspace disease. No pleural effusions. No pneumothorax. No pulmonary nodule or mass noted. Pulmonary vasculature and the cardiomediastinal silhouette are within normal limits. IMPRESSION: No radiographic evidence of acute cardiopulmonary disease. Electronically Signed   By: Trudie Reedaniel  Entrikin M.D.   On: 06/18/2016 21:19    Procedures Procedures (including critical care time)  Medications Ordered in ED Medications  lisinopril (PRINIVIL,ZESTRIL) tablet 10 mg (10 mg Oral Given 06/18/16 2344)  aspirin chewable tablet 324 mg (324 mg Oral Given 06/18/16 2345)  gi cocktail (Maalox,Lidocaine,Donnatal) (30 mLs Oral Given 06/18/16 2346)     Initial Impression / Assessment and Plan / ED Course  I have reviewed the triage vital signs and the nursing notes.  Pertinent labs & imaging results that were available during my care of the patient were reviewed by me and considered in my medical decision making (see chart for details).  Clinical Course    Pain improved after GI cocktail. BP improved with home dose of lisinopril. Delta troponin negative. EKG nonacute. CXR clear. Labs otherwise unrevealing. HEART score 1. Doubt ACS. PERC negative. Doubt PE. Will send home with rx for PPI to trial for chest pain. Instructed to take BP med every morning as perscribed. Instructed close PCP follow up. ER return precautions given.  Final Clinical Impressions(s) / ED Diagnoses   Final diagnoses:  Chest pain, unspecified chest pain type  Essential hypertension    New Prescriptions New Prescriptions   OMEPRAZOLE (PRILOSEC) 20 MG CAPSULE    Take 1 capsule (20 mg total) by mouth daily.     Carlene CoriaSerena Y Davante Gerke, PA-C 06/19/16 0142    Gilda Creasehristopher J Pollina, MD 06/20/16 [phone]

## 2016-09-08 ENCOUNTER — Other Ambulatory Visit: Payer: Self-pay | Admitting: Family

## 2016-09-08 DIAGNOSIS — G4452 New daily persistent headache (NDPH): Secondary | ICD-10-CM

## 2016-09-13 ENCOUNTER — Ambulatory Visit
Admission: RE | Admit: 2016-09-13 | Discharge: 2016-09-13 | Disposition: A | Discharge status: Home / Self Care | Payer: 59 | Source: Ambulatory Visit | Attending: Family | Admitting: Family

## 2016-09-13 DIAGNOSIS — G4452 New daily persistent headache (NDPH): Secondary | ICD-10-CM

## 2016-10-16 ENCOUNTER — Encounter: Payer: Self-pay | Admitting: Family

## 2016-11-19 ENCOUNTER — Other Ambulatory Visit: Payer: Self-pay

## 2016-11-19 ENCOUNTER — Encounter (HOSPITAL_COMMUNITY): Payer: Self-pay | Admitting: *Deleted

## 2016-11-19 ENCOUNTER — Emergency Department (HOSPITAL_COMMUNITY): Payer: 59

## 2016-11-19 ENCOUNTER — Emergency Department (HOSPITAL_COMMUNITY)
Admission: EM | Admit: 2016-11-19 | Discharge: 2016-11-20 | Disposition: A | Discharge status: Home / Self Care | Payer: 59 | Attending: Emergency Medicine | Admitting: Emergency Medicine

## 2016-11-19 DIAGNOSIS — I10 Essential (primary) hypertension: Secondary | ICD-10-CM | POA: Diagnosis not present

## 2016-11-19 DIAGNOSIS — Z79899 Other long term (current) drug therapy: Secondary | ICD-10-CM | POA: Diagnosis not present

## 2016-11-19 DIAGNOSIS — R079 Chest pain, unspecified: Secondary | ICD-10-CM

## 2016-11-19 DIAGNOSIS — R0789 Other chest pain: Secondary | ICD-10-CM | POA: Insufficient documentation

## 2016-11-19 LAB — BASIC METABOLIC PANEL
ANION GAP: 12 (ref 5–15)
BUN: 10 mg/dL (ref 6–20)
CALCIUM: 9.8 mg/dL (ref 8.9–10.3)
CO2: 26 mmol/L (ref 22–32)
CREATININE: 1.04 mg/dL (ref 0.61–1.24)
Chloride: 98 mmol/L — ABNORMAL LOW (ref 101–111)
Glucose, Bld: 96 mg/dL (ref 65–99)
Potassium: 4.4 mmol/L (ref 3.5–5.1)
SODIUM: 136 mmol/L (ref 135–145)

## 2016-11-19 LAB — CBC
HCT: 43.6 % (ref 39.0–52.0)
HEMOGLOBIN: 14.1 g/dL (ref 13.0–17.0)
MCH: 27.9 pg (ref 26.0–34.0)
MCHC: 32.3 g/dL (ref 30.0–36.0)
MCV: 86.2 fL (ref 78.0–100.0)
Platelets: 231 10*3/uL (ref 150–400)
RBC: 5.06 MIL/uL (ref 4.22–5.81)
RDW: 13.6 % (ref 11.5–15.5)
WBC: 4.1 10*3/uL (ref 4.0–10.5)

## 2016-11-19 LAB — I-STAT TROPONIN, ED: TROPONIN I, POC: 0 ng/mL (ref 0.00–0.08)

## 2016-11-19 MED ORDER — FAMOTIDINE 20 MG PO TABS
20.0000 mg | ORAL_TABLET | Freq: Once | ORAL | Status: AC
  Administered 2016-11-19: 20 mg via ORAL
  Filled 2016-11-19: qty 1

## 2016-11-19 MED ORDER — GI COCKTAIL ~~LOC~~
30.0000 mL | Freq: Once | ORAL | Status: AC
  Administered 2016-11-19: 30 mL via ORAL
  Filled 2016-11-19: qty 30

## 2016-11-19 NOTE — ED Triage Notes (Signed)
The pt is c/o  Chest pain while driving over here tonight  He has taken 650mg  of aspirin prior to arrival here  Lt chest pain he reports that he has had for 5 days  He arrived by [facility] no distress  Diarrhea for 2 days he has been seen here in the past for the samer

## 2016-11-19 NOTE — ED Notes (Signed)
No chest pain

## 2016-11-19 NOTE — ED Notes (Signed)
Pt states he just had an episode of "stabbing" pain under his left ribcage that he rated as a 5/10 that "came and went" . No pain at this time.

## 2016-11-19 NOTE — ED Notes (Signed)
The pt is c/o chest pain intermittently  afew seconds at a time

## 2016-11-19 NOTE — ED Provider Notes (Signed)
[facility] Provider Note   CSN: [id number] Arrival date & time: 11/19/16  2041     History   Chief Complaint Chief Complaint  Patient presents with  . Chest Pain     HPI  Blood pressure 142/97, pulse 71, temperature 98.7 F (37.1 C), temperature source Oral, resp. rate 18, height 6\' 3"  (1.905 m), weight 104.3 kg, SpO2 98 %.  Harold Ayers is a 31 y.o. male complaining of left-sided nonradiating chest pain intermittent onset 4-5 days ago and exacerbated with sitting (states it significantly worse when he is driving) he describes it as a tightness. He states that it lasts for 20-30 minutes at a time. He's had intermittently over the course the last 4-5 days. Associated with shortness of breath and palpitations with no syncope, cough, history of DVT/PE, recent mobilizations, calf pain, leg swelling.  Patient does have primary care and is evaluated regularly. He is compliant with his lisinopril which he takes for high blood pressure. He is a family history of ACS with father dying at age 31. He has never been a smoker, no diabetes, hyperlipidemia, cocaine or methamphetamine use. He was given sublingual nitroglycerin by EMS fully resolved the pain. He gave himself 650 mg of aspirin prior to arrival. He states he has an appointment with the cardiologist tomorrow at 1 PM.  Past Medical History:  Diagnosis Date  . Hypertension   . Seizures (HCC)     There are no active problems to display for this patient.   History reviewed. No pertinent surgical history.     Home Medications    Prior to Admission medications   Medication Sig Start Date End Date Taking? Authorizing Provider  alum & mag hydroxide-simeth (MAALOX/MYLANTA) 200-200-20 MG/5ML suspension Take 30 mLs by mouth every 6 (six) hours as needed for indigestion or heartburn.   Yes Historical Provider, MD  lisinopril (PRINIVIL,ZESTRIL) 10 MG tablet Take 10 mg by mouth daily.   Yes Historical Provider, MD  Omega-3 Fatty  Acids (FISH OIL) 1000 MG CAPS Take 3,000 mg by mouth daily.   Yes Historical Provider, MD  omeprazole (PRILOSEC) 20 MG capsule Take 1 capsule (20 mg total) by mouth daily. 06/19/16  Yes Carlene CoriaSerena Y Sam, PA-C    Family History No family history on file.  Social History Social History  Substance Use Topics  . Smoking status: Never Smoker  . Smokeless tobacco: Never Used  . Alcohol use Yes     Allergies   Patient has no known allergies.   Review of Systems Review of Systems  10 systems reviewed and found to be negative, except as noted in the HPI.   Physical Exam Updated Vital Signs BP 132/87   Pulse 63   Temp 98.7 F (37.1 C) (Oral)   Resp 16   Ht 6\' 3"  (1.905 m)   Wt 104.3 kg   SpO2 100%   BMI 28.75 kg/m   Physical Exam  Constitutional: He is oriented to person, place, and time. He appears well-developed and well-nourished. No distress.  HENT:  Head: Normocephalic.  Mouth/Throat: Oropharynx is clear and moist.  Eyes: Conjunctivae are normal.  Neck: Normal range of motion. No JVD present. No tracheal deviation present.  Cardiovascular: Normal rate, regular rhythm and intact distal pulses.   Radial pulse equal bilaterally  Pulmonary/Chest: Effort normal and breath sounds normal. No stridor. No respiratory distress. He has no wheezes. He has no rales. He exhibits no tenderness.  Abdominal: Soft. He exhibits no distension and no mass.  There is no tenderness. There is no rebound and no guarding.  Musculoskeletal: Normal range of motion. He exhibits no edema or tenderness.  No calf asymmetry, superficial collaterals, palpable cords, edema, Homans sign negative bilaterally.    Neurological: He is alert and oriented to person, place, and time.  Skin: Skin is warm. He is not diaphoretic.  Psychiatric: He has a normal mood and affect.  Nursing note and vitals reviewed.    ED Treatments / Results  Labs (all labs ordered are listed, but only abnormal results are  displayed) Labs Reviewed  BASIC METABOLIC PANEL - Abnormal; Notable for the following:       Result Value   Chloride 98 (*)    All other components within normal limits  CBC  I-STAT TROPOININ, ED  I-STAT TROPOININ, ED    EKG  EKG Interpretation  Date/Time:  Wednesday November 19 2016 20:40:01 EST Ventricular Rate:  68 PR Interval:  146 QRS Duration: 86 QT Interval:  380 QTC Calculation: 404 R Axis:   61 Text Interpretation:  Normal sinus rhythm ST elevation, consider early repolarization Borderline ECG When compared to prior, T waves taller and sharper in leacs V1-V3.  No STEMI Confirmed by Rush Landmark MD, CHRISTOPHER [phone]) on 11/19/2016 8:45:04 PM       Radiology Dg Chest 2 View  Result Date: 11/19/2016 CLINICAL DATA:  31 y/o  M; chest pain. EXAM: CHEST  2 VIEW COMPARISON:  06/18/2016 chest radiograph FINDINGS: Stable heart size and mediastinal contours are within normal limits. Both lungs are clear. The visualized skeletal structures are unremarkable. IMPRESSION: No active cardiopulmonary disease. Electronically Signed   By: Mitzi Hansen M.D.   On: 11/19/2016 21:25    Procedures Procedures (including critical care time)  Medications Ordered in ED Medications  gi cocktail (Maalox,Lidocaine,Donnatal) (30 mLs Oral Given 11/19/16 2230)  famotidine (PEPCID) tablet 20 mg (20 mg Oral Given 11/19/16 2229)     Initial Impression / Assessment and Plan / ED Course  I have reviewed the triage vital signs and the nursing notes.  Pertinent labs & imaging results that were available during my care of the patient were reviewed by me and considered in my medical decision making (see chart for details).     Vitals:   11/19/16 2300 11/19/16 2330 11/20/16 0030 11/20/16 0100  BP: 142/90 (!) 145/102 145/100 132/87  Pulse: 69 67 63 63  Resp: 17 17 13 16   Temp:      TempSrc:      SpO2: 100% 100% 99% 100%  Weight:      Height:        Medications  gi cocktail  (Maalox,Lidocaine,Donnatal) (30 mLs Oral Given 11/19/16 2230)  famotidine (PEPCID) tablet 20 mg (20 mg Oral Given 11/19/16 2229)    Harold Ayers is 31 y.o. male presenting with  Intermittent left-sided chest tightness over the last 4-5 days. Patient has family history of ACS with father dying at age 56. He also has a history of hypertension and he's been compliant with his lisinopril. EKG with no acute findings. Troponin negative. He is low risk by heart score. Patient states that the pain was initially alleviated with nitroglycerin, it returned in the ED, I've given him a GI cocktail and he has remained pain-free. Will check delta troponin.  Patients remains chest pain-free in the ED. Delta troponin is negative, will follow closely with cardiology this afternoon.  Evaluation does not show pathology that would require ongoing emergent intervention or inpatient treatment. Pt is  hemodynamically stable and mentating appropriately. Discussed findings and plan with patient/guardian, who agrees with care plan. All questions answered. Return precautions discussed and outpatient follow up given.    Final Clinical Impressions(s) / ED Diagnoses   Final diagnoses:  Chest pain, unspecified type    New Prescriptions Discharge Medication List as of 11/20/2016  1:25 AM       Wynetta Emery, PA-C 11/20/16 7564    Canary Brim Tegeler, MD 11/20/16 [phone]

## 2016-11-20 LAB — I-STAT TROPONIN, ED: TROPONIN I, POC: 0 ng/mL (ref 0.00–0.08)

## 2016-11-20 NOTE — Discharge Instructions (Signed)
Please follow with your cardiologist at your appointment this afternoon.  Don't hesitate to return to the emergency room for any new, worsening or concerning symptoms.

## 2016-12-09 ENCOUNTER — Encounter (HOSPITAL_COMMUNITY): Payer: Self-pay | Admitting: Emergency Medicine

## 2016-12-09 ENCOUNTER — Ambulatory Visit (HOSPITAL_COMMUNITY)
Admission: EM | Admit: 2016-12-09 | Discharge: 2016-12-09 | Disposition: A | Discharge status: Home / Self Care | Payer: 59 | Attending: Family Medicine | Admitting: Family Medicine

## 2016-12-09 DIAGNOSIS — M940 Chondrocostal junction syndrome [Tietze]: Secondary | ICD-10-CM | POA: Diagnosis not present

## 2016-12-09 NOTE — ED Triage Notes (Signed)
[facility] a [facility] discomfort that has been diagnosed x 2 at the ED as reflux.

## 2016-12-09 NOTE — Discharge Instructions (Signed)
I would try tylenol and ibuprofen as needed for the pain.

## 2016-12-09 NOTE — ED Provider Notes (Signed)
CSN: [id number]     Arrival date & time 12/09/16  1317 History   None    Chief Complaint  Patient presents with  . Gastroesophageal Reflux   (Consider location/radiation/quality/duration/timing/severity/associated sxs/prior Treatment) Patient presenting with chronic chest pain. She states that he has had chest pain since February 2017. He states that it is constant. Not associated with nausea, vomiting, diaphoresis or exacerbated by exertion Previously seen on 2/28 and 3/1 for chest pain in the ED with negative i-STAT troponins and EKG diagnosed both times with reflux. Has seen cardiology and was placed on a 48 hour Holter monitor with no significant findings, again diagnosed with reflux. Now presenting to the urgent care clinic with similar symptoms. Has been taking prilosec daily and mylanta, no significant changes in chest pain. Denies any burning sensation or burping. Denies any changes with foods.  Chest pain is worse when patient is sitting up right, pain is better when he lays down. Patient has taken ibuprofen for the pain without any significant changes. Patient worked out last night. Indicates having some soreness with pressing on chest. No signs of infection, or complaints of cough.      Past Medical History:  Diagnosis Date  . Hypertension   . Seizures (HCC)    History reviewed. No pertinent surgical history. History reviewed. No pertinent family history. Social History  Substance Use Topics  . Smoking status: Never Smoker  . Smokeless tobacco: Never Used  . Alcohol use Yes    Review of Systems  Constitutional: Negative for chills and fever.  HENT: Negative for congestion.   Respiratory: Negative for cough and shortness of breath.   Cardiovascular: Positive for chest pain. Negative for palpitations and leg swelling.  Gastrointestinal: Negative for nausea and vomiting.  Musculoskeletal: Negative for back pain.    Allergies  Patient has no known allergies.  Home  Medications   Prior to Admission medications   Medication Sig Start Date End Date Taking? Authorizing Provider  lisinopril (PRINIVIL,ZESTRIL) 10 MG tablet Take 10 mg by mouth daily.   Yes Historical Provider, MD  omeprazole (PRILOSEC) 20 MG capsule Take 1 capsule (20 mg total) by mouth daily. 06/19/16  Yes Ace Gins Sam, PA-C   Meds Ordered and Administered this Visit  Medications - No data to display  BP (!) 158/96 (BP Location: Right Arm)   Pulse 77   Temp 98 F (36.7 C) (Oral)   Resp 18   SpO2 100%  No data found.   Physical Exam  Constitutional: He is oriented to person, place, and time. He appears well-developed and well-nourished.  HENT:  Head: Normocephalic and atraumatic.  Eyes: EOM are normal. Pupils are equal, round, and reactive to light.  Neck: Normal range of motion. Neck supple.  Cardiovascular: Normal rate, regular rhythm, normal heart sounds and intact distal pulses.   Chest pain reproducible on exam   Pulmonary/Chest: Effort normal and breath sounds normal. No respiratory distress. He has no wheezes.  Abdominal: Soft. Bowel sounds are normal. He exhibits no distension. There is no tenderness.  Musculoskeletal: Normal range of motion.  Neurological: He is alert and oriented to person, place, and time.  Skin: Skin is warm. Capillary refill takes less than 2 seconds.    Urgent Care Course     Procedures (including critical care time)  Labs Review Labs Reviewed - No data to display  Imaging Review No results found.     MDM   1. Costochondritis   - Patient with an extensive  history of workup for chronic constant chest pain, atypical in nature. No associated cardiac findings. Currently with continued chest pain, reproducible on exam. Likely due to patient's recent work out versus chronic chest pain, non-cardiac. Signs and symptoms are of infection on exam or in history. - Tylenol as needed for chest pain - Follow up with PCP if no improvement for further  workup     Palmira Stickle Mayra ReelZahra Alarik Radu, MD 12/09/16 1423

## 2016-12-29 ENCOUNTER — Encounter: Payer: Self-pay | Admitting: Gastroenterology

## 2017-01-05 ENCOUNTER — Telehealth: Payer: Self-pay | Admitting: Gastroenterology

## 2017-01-05 ENCOUNTER — Ambulatory Visit: Payer: 59 | Admitting: Gastroenterology

## 2017-01-05 NOTE — Telephone Encounter (Signed)
No Charge 

## 2017-02-20 ENCOUNTER — Encounter (INDEPENDENT_AMBULATORY_CARE_PROVIDER_SITE_OTHER): Payer: Self-pay

## 2017-02-20 ENCOUNTER — Encounter: Payer: Self-pay | Admitting: Gastroenterology

## 2017-02-20 ENCOUNTER — Ambulatory Visit (INDEPENDENT_AMBULATORY_CARE_PROVIDER_SITE_OTHER): Payer: 59 | Admitting: Gastroenterology

## 2017-02-20 VITALS — BP 140/110 | HR 80 | Ht 74.0 in | Wt 242.4 lb

## 2017-02-20 DIAGNOSIS — R1013 Epigastric pain: Secondary | ICD-10-CM

## 2017-02-20 DIAGNOSIS — R0789 Other chest pain: Secondary | ICD-10-CM | POA: Diagnosis not present

## 2017-02-20 DIAGNOSIS — K219 Gastro-esophageal reflux disease without esophagitis: Secondary | ICD-10-CM

## 2017-02-20 NOTE — Patient Instructions (Addendum)
You have been scheduled for an abdominal ultrasound at [facility] (1st floor of hospital) on 02/25/2017 at 8:00. Please arrive 15 minutes prior to your appointment for registration. Make certain not to have anything to eat or drink 6 hours prior to your appointment. Should you need to reschedule your appointment, please contact radiology at [phone]. This test typically takes about 30 minutes to perform.  You have been scheduled for an endoscopy. Please follow written instructions given to you at your visit today. If you use inhalers (even only as needed), please bring them with you on the day of your procedure.   Continue omeprazole

## 2017-02-20 NOTE — Progress Notes (Signed)
Harold Ayers    [id number]    1986-08-28  Primary Care Physician:Brown, Victorino Dike, NP (Inactive)  Referring Physician: Boneta Lucks, NP [phone] [address] [place], [place] [address]  Chief complaint:  Heartburn, atypical chest pain, epigastric abdominal pain  HPI: 31 year old male here for new patient visit with complaints of heartburn, retrosternal chest pain radiating to his neck and epigastric abdominal pain for past few months. He has been to the ER twice so far with the same complaints in past 3 months. He was evaluated for possible cardiac chest pain and was negative. He also saw her cardiologist and chest pain was thought not to be cardiac origin. He was started on omeprazole once daily about a month ago with some improvement but continues to have intermittent heartburn and chest pain. His symptoms are worse when he is sitting. No relationship with food intake or activity. It doesn't wake up at night with heartburn. Denies any palpitations or shortness of breath. No dysphagia or odynophagia. He has family history of CAD. No family history of GI malignancy or IBD.  Sitting or driving makes worse    Outpatient Encounter Prescriptions as of 02/20/2017  Medication Sig  . lisinopril (PRINIVIL,ZESTRIL) 10 MG tablet Take 10 mg by mouth daily.  Marland Kitchen omeprazole (PRILOSEC) 40 MG capsule Take 1 capsule by mouth daily.  . [DISCONTINUED] omeprazole (PRILOSEC) 20 MG capsule Take 1 capsule (20 mg total) by mouth daily.   No facility-administered encounter medications on file as of 02/20/2017.     Allergies as of 02/20/2017  . (No Known Allergies)    Past Medical History:  Diagnosis Date  . Anxiety   . GERD (gastroesophageal reflux disease)   . Hypertension   . Seizures (HCC)     Past Surgical History:  Procedure Laterality Date  . NO PAST SURGERIES      Family History  Problem Relation Age of Onset  . Hypertension Mother   . Diabetes Father   . Heart attack Father   .  Drug abuse Father   . Healthy Sister   . Healthy Brother   . Heart attack Maternal Grandmother   . Heart disease Maternal Grandmother   . Diabetes Paternal Grandmother   . Diabetes Paternal Grandfather   . Kidney disease Paternal Uncle   . Diabetes Paternal Uncle   . Healthy Brother     Social History   Social History  . Marital status: Single    Spouse name: N/A  . Number of children: 2  . Years of education: N/A   Occupational History  . [employer]    Social History Main Topics  . Smoking status: Never Smoker  . Smokeless tobacco: Never Used  . Alcohol use Yes     Comment: 1 or 2 a day  . Drug use: No  . Sexual activity: Not on file   Other Topics Concern  . Not on file   Social History Narrative  . No narrative on file      Review of systems: Review of Systems  Constitutional: Negative for fever and chills.  HENT: Negative.   Eyes: Negative for blurred vision.  Respiratory: Negative for cough, shortness of breath and wheezing.   Cardiovascular: Negative for chest pain and palpitations.  Gastrointestinal: as per HPI Genitourinary: Negative for dysuria, urgency, frequency and hematuria.  Musculoskeletal: Negative for myalgias, back pain and joint pain.  Skin: Negative for itching and rash.  Neurological: Negative for dizziness,  tremors, focal weakness, seizures and loss of consciousness.  Endo/Heme/Allergies: Positive for seasonal allergies.  Psychiatric/Behavioral: Negative for depression, suicidal ideas and hallucinations.  All other systems reviewed and are negative.   Physical Exam: Vitals:   02/20/17 1434  BP: (!) 140/110  Pulse: 80   Body mass index is 31.12 kg/m. Gen:      No acute distress HEENT:  EOMI, sclera anicteric Neck:     No masses; no thyromegaly Lungs:    Clear to auscultation bilaterally; normal respiratory effort CV:         Regular rate and rhythm; no murmurs Abd:      + bowel sounds; soft; no palpable masses, no distension.  Mild tenderness in right upper quadrant epigastric region Ext:    No edema; adequate peripheral perfusion Skin:      Warm and dry; no rash Neuro: alert and oriented x 3 Psych: normal mood and affect  Data Reviewed:  Reviewed labs, radiology imaging, old records and pertinent past GI work up   Assessment and Plan/Recommendations:  31 year old male with heartburn, atypical chest pain and epigastric abdominal pain  Schedule for EGD to evaluate for esophagitis, peptic ulcer disease The risks and benefits as well as alternatives of endoscopic procedure(s) have been discussed and reviewed. All questions answered. The patient agrees to proceed.  Continue omeprazole daily Discussed lifestyle modification and antireflux measures  Epigastric abdominal pain: We'll obtain right upper quadrant ultrasound to exclude gallbladder disease  Return as needed    K. Scherry RanVeena Kelani Robart , MD [phone] Mon-Fri 8a-5p [phone] after 5p, weekends, holidays  CC: Boneta LucksBrown, Jennifer, NP

## 2017-02-22 DIAGNOSIS — K219 Gastro-esophageal reflux disease without esophagitis: Secondary | ICD-10-CM | POA: Insufficient documentation

## 2017-02-22 DIAGNOSIS — R0789 Other chest pain: Secondary | ICD-10-CM | POA: Insufficient documentation

## 2017-02-22 DIAGNOSIS — R1013 Epigastric pain: Secondary | ICD-10-CM | POA: Insufficient documentation

## 2017-02-24 ENCOUNTER — Encounter: Payer: Self-pay | Admitting: Gastroenterology

## 2017-02-25 ENCOUNTER — Ambulatory Visit (HOSPITAL_COMMUNITY)
Admission: RE | Admit: 2017-02-25 | Discharge: 2017-02-25 | Disposition: A | Discharge status: Home / Self Care | Payer: 59 | Source: Ambulatory Visit | Attending: Gastroenterology | Admitting: Gastroenterology

## 2017-02-25 DIAGNOSIS — R1013 Epigastric pain: Secondary | ICD-10-CM | POA: Diagnosis not present

## 2017-02-25 DIAGNOSIS — R0789 Other chest pain: Secondary | ICD-10-CM | POA: Insufficient documentation

## 2017-02-25 DIAGNOSIS — K219 Gastro-esophageal reflux disease without esophagitis: Secondary | ICD-10-CM | POA: Diagnosis not present

## 2017-02-25 DIAGNOSIS — R932 Abnormal findings on diagnostic imaging of liver and biliary tract: Secondary | ICD-10-CM | POA: Diagnosis not present

## 2017-03-04 ENCOUNTER — Encounter: Payer: Self-pay | Admitting: Gastroenterology

## 2017-03-04 ENCOUNTER — Ambulatory Visit (AMBULATORY_SURGERY_CENTER): Payer: 59 | Admitting: Gastroenterology

## 2017-03-04 VITALS — BP 159/102 | HR 77 | Temp 98.9°F | Resp 17 | Ht 74.0 in | Wt 242.0 lb

## 2017-03-04 DIAGNOSIS — K2970 Gastritis, unspecified, without bleeding: Secondary | ICD-10-CM

## 2017-03-04 DIAGNOSIS — R1013 Epigastric pain: Secondary | ICD-10-CM

## 2017-03-04 MED ORDER — SODIUM CHLORIDE 0.9 % IV SOLN: 500.0000 mL | INTRAVENOUS | Status: AC

## 2017-03-04 NOTE — Progress Notes (Signed)
Dental advisory given to patientAlert and oriented x3, pleased with MAC, report to RN Sarah 

## 2017-03-04 NOTE — Op Note (Signed)
[facility] Patient Name: Harold Ayers Procedure Date: 03/04/2017 10:59 AM MRN: [id number] Endoscopist: Napoleon FormKavitha V. Loretto Belinsky , MD Age: 31 Referring MD:  Date of Birth: [birth year] Gender: Male Account #: [id number] Procedure:                Upper GI endoscopy Indications:              Esophageal reflux symptoms that persist despite                            appropriate therapy, Epigastric abdominal pain Medicines:                Monitored Anesthesia Care Procedure:                Pre-Anesthesia Assessment:                           - Prior to the procedure, a History and Physical                            was performed, and patient medications and                            allergies were reviewed. The patient's tolerance of                            previous anesthesia was also reviewed. The risks                            and benefits of the procedure and the sedation                            options and risks were discussed with the patient.                            All questions were answered, and informed consent                            was obtained. Prior Anticoagulants: The patient has                            taken no previous anticoagulant or antiplatelet                            agents. ASA Grade Assessment: II - A patient with                            mild systemic disease. After reviewing the risks                            and benefits, the patient was deemed in                            satisfactory condition to undergo the procedure.  After obtaining informed consent, the endoscope was                            passed under direct vision. Throughout the                            procedure, the patient's blood pressure, pulse, and                            oxygen saturations were monitored continuously. The                            Model GIF-[id number]) scope was introduced                            through  the mouth, and advanced to the second part                            of duodenum. The upper GI endoscopy was                            accomplished without difficulty. The patient                            tolerated the procedure well. Scope In: Scope Out: Findings:                 A small hiatal hernia was present.                           Scattered mild inflammation characterized by                            congestion (edema), erythema and friability was                            found in the entire examined stomach. Biopsies were                            taken with a cold forceps for histology.                           A few localized erosions without bleeding were                            found in the duodenal bulb and in the second                            portion of the duodenum. Biopsies were taken with a                            cold forceps for histology. Complications:            No immediate complications. Estimated Blood Loss:     Estimated blood loss was minimal. Impression:               -  Small hiatal hernia.                           - Gastritis. Biopsied.                           - Duodenal erosions without bleeding. Biopsied. Recommendation:           - Resume previous diet.                           - Continue present medications.                           - Await pathology results.                           - No aspirin, ibuprofen, naproxen, or other                            non-steroidal anti-inflammatory drugs.                           - Return to GI office PRN. Napoleon Form, MD 03/04/2017 11:15:14 AM This report has been signed electronically.

## 2017-03-04 NOTE — Patient Instructions (Signed)
YOU HAD AN ENDOSCOPIC PROCEDURE TODAY AT THE [facility]:   Refer to the procedure report that was given to you for any specific questions about what was found during the examination.  If the procedure report does not answer your questions, please call your gastroenterologist to clarify.  If you requested that your care partner not be given the details of your procedure findings, then the procedure report has been included in a sealed envelope for you to review at your convenience later.  YOU SHOULD EXPECT: Some feelings of bloating in the abdomen. Passage of more gas than usual.  Walking can help get rid of the air that was put into your GI tract during the procedure and reduce the bloating. If you had a lower endoscopy (such as a colonoscopy or flexible sigmoidoscopy) you may notice spotting of blood in your stool or on the toilet paper. If you underwent a bowel prep for your procedure, you may not have a normal bowel movement for a few days.  Please Note:  You might notice some irritation and congestion in your nose or some drainage.  This is from the oxygen used during your procedure.  There is no need for concern and it should clear up in a day or so.  SYMPTOMS TO REPORT IMMEDIATELY:    Following upper endoscopy (EGD)  Vomiting of blood or coffee ground material  New chest pain or pain under the shoulder blades  Painful or persistently difficult swallowing  New shortness of breath  Fever of 100F or higher  Black, tarry-looking stools  For urgent or emergent issues, a gastroenterologist can be reached at any hour by calling [phone].   DIET:  We do recommend a small meal at first, but then you may proceed to your regular diet.  Drink plenty of fluids but you should avoid alcoholic beverages for 24 hours.  ACTIVITY:  You should plan to take it easy for the rest of today and you should NOT DRIVE or use heavy machinery until tomorrow (because of the sedation medicines used  during the test).    FOLLOW UP: Our staff will call the number listed on your records the next business day following your procedure to check on you and address any questions or concerns that you may have regarding the information given to you following your procedure. If we do not reach you, we will leave a message.  However, if you are feeling well and you are not experiencing any problems, there is no need to return our call.  We will assume that you have returned to your regular daily activities without incident.  If any biopsies were taken you will be contacted by phone or by letter within the next 1-3 weeks.  Please call us at [phone] if you have not heard about the biopsies in 3 weeks.    SIGNATURES/CONFIDENTIALITY: You and/or your care partner have signed paperwork which will be entered into your electronic medical record.  These signatures attest to the fact that that the information above on your After Visit Summary has been reviewed and is understood.  Full responsibility of the confidentiality of this discharge information lies with you and/or your care-partner.   No aspirin,ibuprofen,naproxen,or other non-steroidal anti-inflammatory drugs. Information given on Hiatal Hernia and Gastritis.

## 2017-03-04 NOTE — Progress Notes (Signed)
Called to room to assist during endoscopic procedure.  Patient ID and intended procedure confirmed with present staff. Received instructions for my participation in the procedure from the performing physician.  

## 2017-03-05 ENCOUNTER — Telehealth: Payer: Self-pay

## 2017-03-05 ENCOUNTER — Telehealth: Payer: Self-pay | Admitting: *Deleted

## 2017-03-05 NOTE — Telephone Encounter (Signed)
Attempted to reach patient for post-procedure f/u. No answer. Left message that we will attempt to reach him again later today and for him to call us if he has any questions/concerns about his care.

## 2017-03-05 NOTE — Telephone Encounter (Signed)
Left message and will attempt to call back later this afternoon for follow up call. SM 

## 2017-03-10 ENCOUNTER — Encounter: Payer: Self-pay | Admitting: Gastroenterology

## 2017-04-26 ENCOUNTER — Emergency Department (HOSPITAL_COMMUNITY): Payer: 59

## 2017-04-26 ENCOUNTER — Emergency Department (HOSPITAL_COMMUNITY)
Admission: EM | Admit: 2017-04-26 | Discharge: 2017-04-26 | Disposition: A | Discharge status: Home / Self Care | Payer: 59 | Attending: Emergency Medicine | Admitting: Emergency Medicine

## 2017-04-26 DIAGNOSIS — I10 Essential (primary) hypertension: Secondary | ICD-10-CM | POA: Diagnosis not present

## 2017-04-26 DIAGNOSIS — R079 Chest pain, unspecified: Secondary | ICD-10-CM

## 2017-04-26 DIAGNOSIS — Z79899 Other long term (current) drug therapy: Secondary | ICD-10-CM | POA: Diagnosis not present

## 2017-04-26 LAB — BASIC METABOLIC PANEL
ANION GAP: 9 (ref 5–15)
BUN: 14 mg/dL (ref 6–20)
CO2: 25 mmol/L (ref 22–32)
Calcium: 9.3 mg/dL (ref 8.9–10.3)
Chloride: 103 mmol/L (ref 101–111)
Creatinine, Ser: 1.24 mg/dL (ref 0.61–1.24)
GLUCOSE: 108 mg/dL — AB (ref 65–99)
POTASSIUM: 4.2 mmol/L (ref 3.5–5.1)
Sodium: 137 mmol/L (ref 135–145)

## 2017-04-26 LAB — CBC
HEMATOCRIT: 40.8 % (ref 39.0–52.0)
HEMOGLOBIN: 13.2 g/dL (ref 13.0–17.0)
MCH: 27.2 pg (ref 26.0–34.0)
MCHC: 32.4 g/dL (ref 30.0–36.0)
MCV: 84.1 fL (ref 78.0–100.0)
Platelets: 261 10*3/uL (ref 150–400)
RBC: 4.85 MIL/uL (ref 4.22–5.81)
RDW: 13.6 % (ref 11.5–15.5)
WBC: 5.2 10*3/uL (ref 4.0–10.5)

## 2017-04-26 LAB — I-STAT TROPONIN, ED
Troponin i, poc: 0 ng/mL (ref 0.00–0.08)
Troponin i, poc: 0 ng/mL (ref 0.00–0.08)

## 2017-04-26 MED ORDER — ASPIRIN 81 MG PO CHEW
324.0000 mg | CHEWABLE_TABLET | Freq: Once | ORAL | Status: DC
  Filled 2017-04-26: qty 4

## 2017-04-26 NOTE — ED Notes (Signed)
Patient transported to X-ray 

## 2017-04-26 NOTE — ED Provider Notes (Signed)
[facility]-EMERGENCY DEPT Provider Note   CSN: [id number] Arrival date & time: 04/26/17  1045     History   Chief Complaint Chief Complaint  Patient presents with  . Chest Pain    HPI Harold Ayers is a 31 y.o. male.  Patient is a 31 year old male with a history of hypertension and reflux who presents with chest pain. He states it's been going on for 3 days. He reports it is constant. He describes pain in the center of his chest which radiates to his left jaw and right arm. Currently he only has pain in his jaw and arm but not his chest. He reports occasional shortness of breath. His symptoms are not worse with exertion. He has no coughing or congestion. No recent injuries to the area. He's been having symptoms similar to this frequently in the past. He was referred by his PCP to see cardiology. He has seen Dr. Jacinto HalimGanji and states he had a stress test 2 weeks ago which she reports to be normal. He denies any history of diabetes. He's a nonsmoker. He denies any drug use. He does report that his father had an MI at age 31.      Past Medical History:  Diagnosis Date  . Anxiety   . GERD (gastroesophageal reflux disease)   . Hypertension   . Seizures (HCC)    pt reported last seizure was 5 years ago    Patient Active Problem List   Diagnosis Date Noted  . Gastroesophageal reflux disease 02/22/2017  . Abdominal pain, epigastric 02/22/2017  . Chest pain, atypical 02/22/2017    Past Surgical History:  Procedure Laterality Date  . COLONOSCOPY    . NO PAST SURGERIES    . WISDOM TOOTH EXTRACTION         Home Medications    Prior to Admission medications   Medication Sig Start Date End Date Taking? Authorizing Provider  DOCOSAHEXAENOIC ACID PO Take 1 capsule by mouth 2 (two) times daily.   Yes [provider]  lisinopril (PRINIVIL,ZESTRIL) 40 MG tablet Take 40 mg by mouth daily.    Yes [provider]  omeprazole (PRILOSEC) 40 MG capsule Take 40 mg by mouth daily as  needed (reflux).  01/15/17  Yes [provider]    Family History Family History  Problem Relation Age of Onset  . Hypertension Mother   . Diabetes Father   . Heart attack Father   . Drug abuse Father   . Healthy Sister   . Healthy Brother   . Heart attack Maternal Grandmother   . Heart disease Maternal Grandmother   . Diabetes Paternal Grandmother   . Diabetes Paternal Grandfather   . Kidney disease Paternal Uncle   . Diabetes Paternal Uncle   . Healthy Brother   . Colon cancer Neg Hx   . Esophageal cancer Neg Hx   . Pancreatic cancer Neg Hx   . Prostate cancer Neg Hx   . Rectal cancer Neg Hx   . Stomach cancer Neg Hx     Social History Social History  Substance Use Topics  . Smoking status: Never Smoker  . Smokeless tobacco: Never Used  . Alcohol use 8.4 oz/week    14 Cans of beer per week     Comment: 1 or 2 a day     Allergies   Patient has no known allergies.   Review of Systems Review of Systems  Constitutional: Negative for chills, diaphoresis, fatigue and fever.  HENT: Negative for  congestion, rhinorrhea and sneezing.   Eyes: Negative.   Respiratory: Positive for shortness of breath. Negative for cough and chest tightness.   Cardiovascular: Positive for chest pain. Negative for leg swelling.  Gastrointestinal: Negative for abdominal pain, blood in stool, diarrhea, nausea and vomiting.  Genitourinary: Negative for difficulty urinating, flank pain, frequency and hematuria.  Musculoskeletal: Negative for arthralgias and back pain.  Skin: Negative for rash.  Neurological: Negative for dizziness, speech difficulty, weakness, numbness and headaches.     Physical Exam Updated Vital Signs BP 127/80   Pulse 67   Temp 99 F (37.2 C) (Oral)   Resp 17   Ht 6\' 3"  (1.905 m)   Wt 108.9 kg (240 lb)   SpO2 97%   BMI 30.00 kg/m   Physical Exam  Constitutional: He is oriented to person, place, and time. He appears well-developed and well-nourished.    HENT:  Head: Normocephalic and atraumatic.  Eyes: Pupils are equal, round, and reactive to light.  Neck: Normal range of motion. Neck supple.  Cardiovascular: Normal rate, regular rhythm and normal heart sounds.   Pulmonary/Chest: Effort normal and breath sounds normal. No respiratory distress. He has no wheezes. He has no rales. He exhibits no tenderness.  Abdominal: Soft. Bowel sounds are normal. There is no tenderness. There is no rebound and no guarding.  Musculoskeletal: Normal range of motion. He exhibits no edema.  Lymphadenopathy:    He has no cervical adenopathy.  Neurological: He is alert and oriented to person, place, and time.  Skin: Skin is warm and dry. No rash noted.  Psychiatric: He has a normal mood and affect.     ED Treatments / Results  Labs (all labs ordered are listed, but only abnormal results are displayed) Labs Reviewed  BASIC METABOLIC PANEL - Abnormal; Notable for the following:       Result Value   Glucose, Bld 108 (*)    All other components within normal limits  CBC  I-STAT TROPONIN, ED  I-STAT TROPONIN, ED    EKG  EKG Interpretation  Date/Time:  Sunday April 26 2017 12:36:32 EDT Ventricular Rate:  79 PR Interval:    QRS Duration: 87 QT Interval:  368 QTC Calculation: 422 R Axis:   55 Text Interpretation:  Sinus rhythm ST elev, probable normal early repol pattern since last tracing no significant change Confirmed by Rolan BuccoBelfi, Estephani Popper [phone]([id number]) on 04/26/2017 12:43:16 PM       Radiology Dg Chest 2 View  Result Date: 04/26/2017 CLINICAL DATA:  Substernal chest pain this morning. EXAM: CHEST  2 VIEW COMPARISON:  PA and lateral chest 11/19/2016 and 06/18/2016. FINDINGS: The lungs are clear. Heart size is normal. No pneumothorax or pleural fluid. No bony abnormality. IMPRESSION: Negative chest. Electronically Signed   By: Drusilla Kannerhomas  Dalessio M.D.   On: 04/26/2017 11:25    Procedures Procedures (including critical care time)  Medications Ordered in  ED Medications  aspirin chewable tablet 324 mg (324 mg Oral Not Given 04/26/17 1239)     Initial Impression / Assessment and Plan / ED Course  I have reviewed the triage vital signs and the nursing notes.  Pertinent labs & imaging results that were available during my care of the patient were reviewed by me and considered in my medical decision making (see chart for details).     Patient presents with chest pain. He had a recent cath he states 2 weeks ago by Dr. Jacinto HalimGanji which he states was normal. He has no ischemic changes  on EKG. He's symptom-free currently. He has no exertional symptoms. He has a low heart score. He has no other suggestions of pulmonary embolus. His chest x-ray is clear without evidence of pneumonia or pneumothorax. He was discharged home in good condition. He was encouraged to follow-up with his cardiologist. Return precautions were given.  Final Clinical Impressions(s) / ED Diagnoses   Final diagnoses:  Nonspecific chest pain    New Prescriptions New Prescriptions   No medications on file     Rolan Bucco, MD 04/26/17 1558

## 2017-04-26 NOTE — ED Triage Notes (Addendum)
Patient arrived to ED via [facility]. EMS reports:  Patient from home. Began experiencing substernal pain approx 0900. Chest pain starts below L breast and radiates to midsternal chest. Has had jaw pain x 3 days.  Patient took ASA 81 mg. EMS administered ASA 81 mg x 3 = 4 tabs total. Administered NTG x 1. No change in chest pain. Headache from NTG.  Denies LOC, nausea, vomiting, shortness of breath.  Cardiologist - Dr. Anselm JunglingGhangi.  ST on 12 lead. BP 68/104, Pulse 110, 100% on room air. CBG 112.  20 G L wrist.

## 2017-04-26 NOTE — ED Notes (Signed)
Pt returns from radiology. 

## 2017-11-08 IMAGING — CR DG CHEST 2V
2 series · 2 of 2 positions shown · non-contrast
Comparison: Chest x-ray 10/24/2015.

CLINICAL DATA: 30-year-old male with chest pain under the left
breast with some pain radiating to the left jaw.

EXAM:
CHEST  2 VIEW

[chest pa]
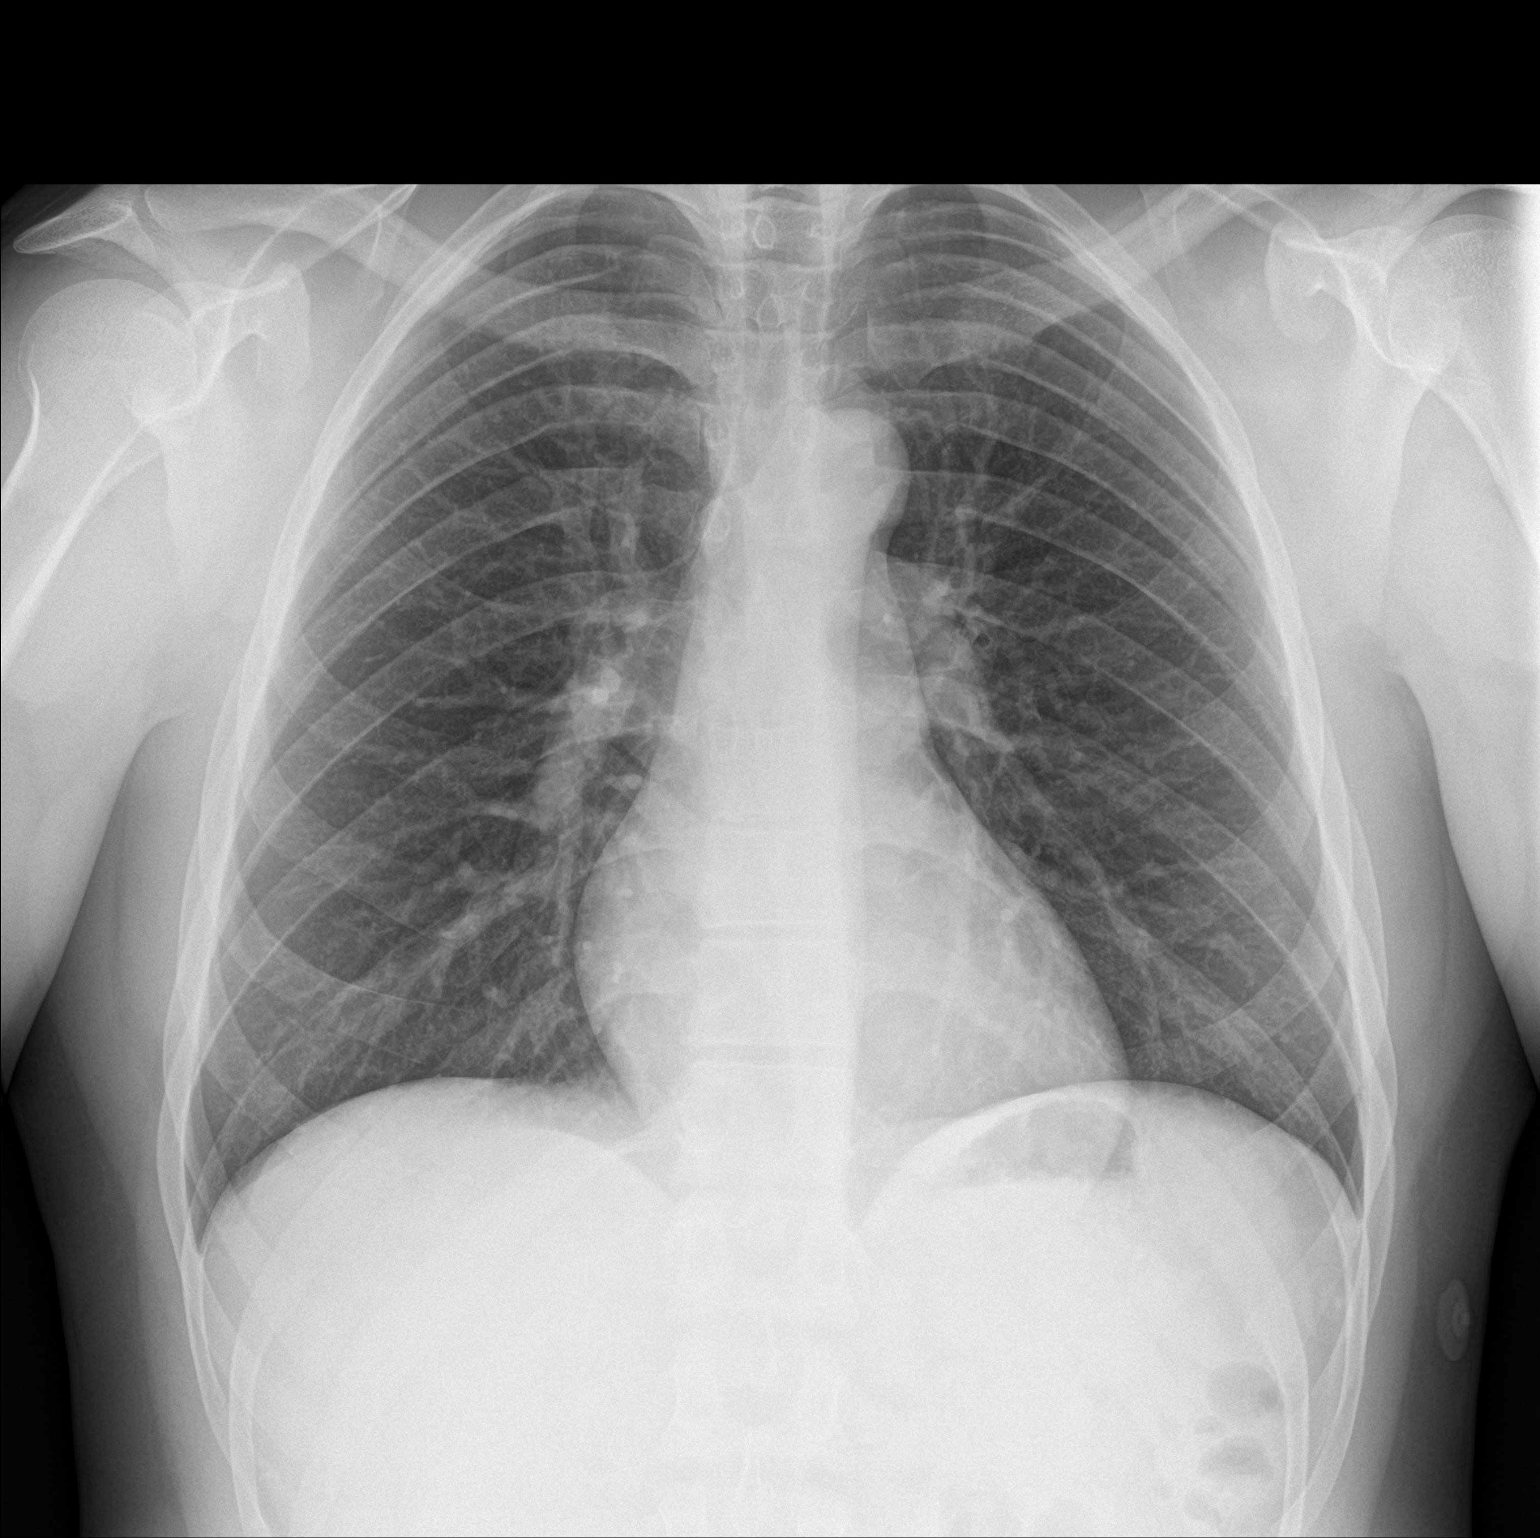

[chest lat]
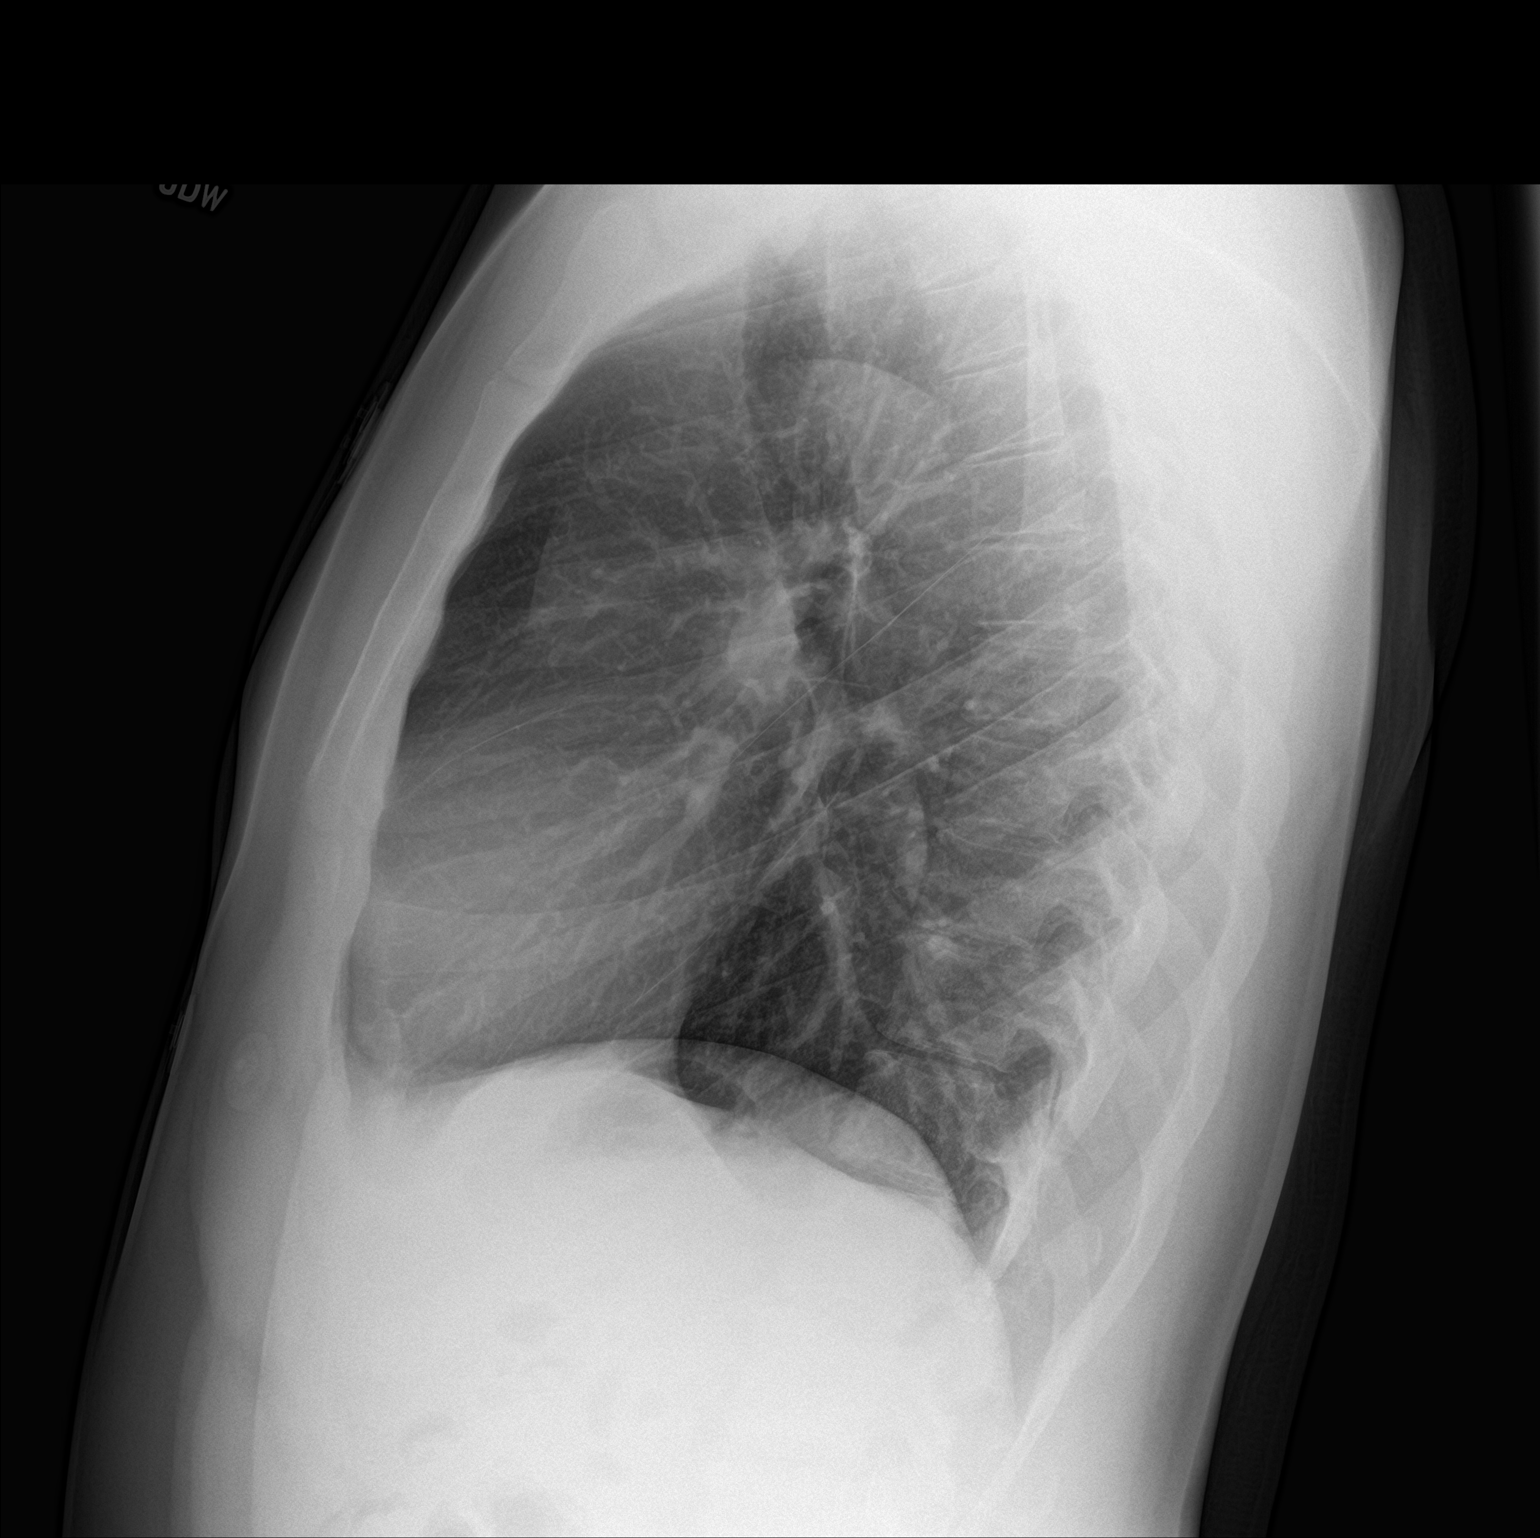

[2 of 2 positions shown; findings below may reference images not displayed]

FINDINGS: Lung volumes are normal. No consolidative airspace disease. No
pleural effusions. No pneumothorax. No pulmonary nodule or mass
noted. Pulmonary vasculature and the cardiomediastinal silhouette
are within normal limits.
IMPRESSION: No radiographic evidence of acute cardiopulmonary disease.

## 2018-04-11 IMAGING — CR DG CHEST 2V
2 series · 2 of 2 positions shown · non-contrast
Comparison: 06/18/2016 chest radiograph

CLINICAL DATA: 30 y/o  M; chest pain.

EXAM:
CHEST  2 VIEW

[chest pa]
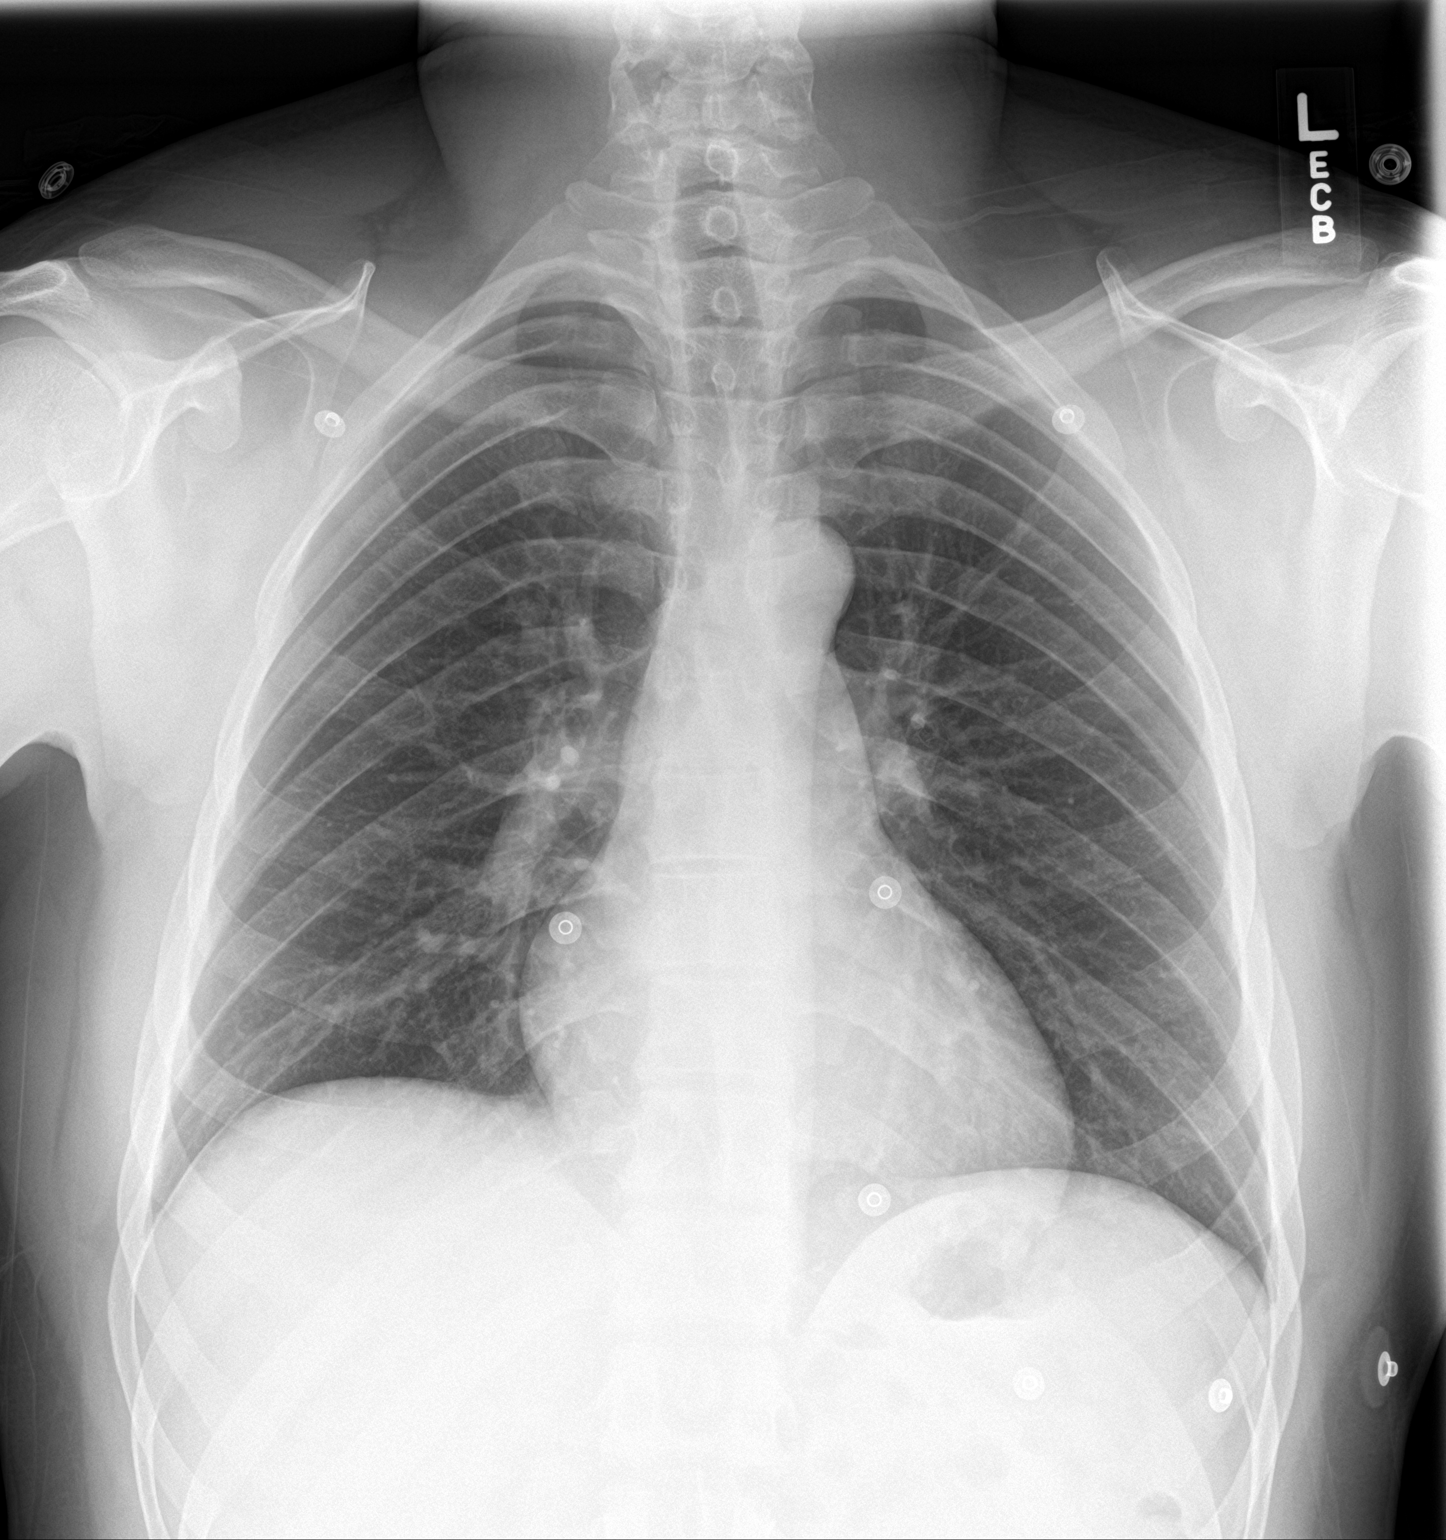

[chest lat]
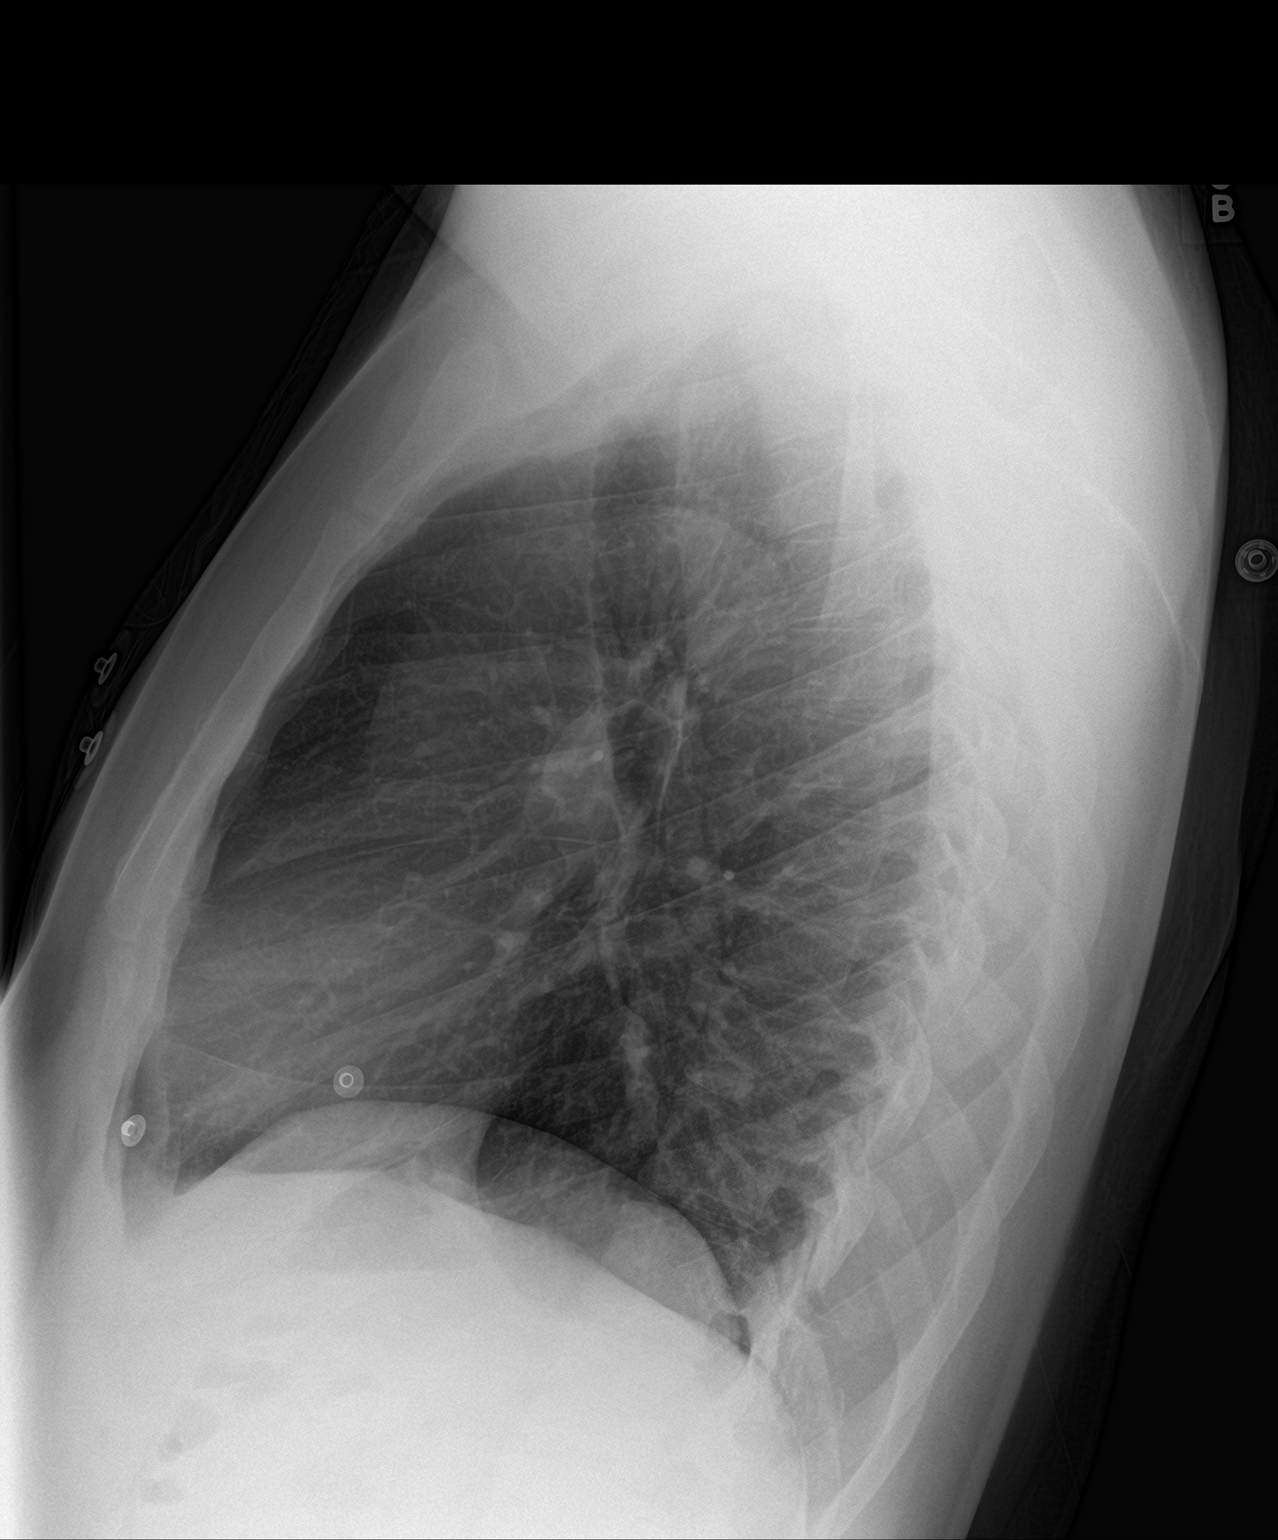

[2 of 2 positions shown; findings below may reference images not displayed]

FINDINGS: Stable heart size and mediastinal contours are within normal limits.
Both lungs are clear. The visualized skeletal structures are
unremarkable.
IMPRESSION: No active cardiopulmonary disease.

By: Ashkenazi Ok M.D.

## 2018-04-27 ENCOUNTER — Encounter (HOSPITAL_COMMUNITY): Payer: Self-pay | Admitting: Emergency Medicine

## 2018-04-27 ENCOUNTER — Emergency Department (HOSPITAL_COMMUNITY)
Admission: EM | Admit: 2018-04-27 | Discharge: 2018-04-28 | Disposition: A | Discharge status: Home / Self Care | Payer: Managed Care, Other (non HMO) | Attending: Emergency Medicine | Admitting: Emergency Medicine

## 2018-04-27 ENCOUNTER — Emergency Department (HOSPITAL_COMMUNITY): Payer: Managed Care, Other (non HMO)

## 2018-04-27 DIAGNOSIS — R0789 Other chest pain: Secondary | ICD-10-CM | POA: Diagnosis not present

## 2018-04-27 DIAGNOSIS — R079 Chest pain, unspecified: Secondary | ICD-10-CM | POA: Diagnosis present

## 2018-04-27 DIAGNOSIS — Z79899 Other long term (current) drug therapy: Secondary | ICD-10-CM | POA: Diagnosis not present

## 2018-04-27 DIAGNOSIS — I10 Essential (primary) hypertension: Secondary | ICD-10-CM | POA: Diagnosis not present

## 2018-04-27 LAB — I-STAT TROPONIN, ED: Troponin i, poc: 0 ng/mL (ref 0.00–0.08)

## 2018-04-27 NOTE — ED Triage Notes (Signed)
Patient reports intermittent mid/upper chest pain /tightness with mild SOB , no nausea or diaphoresis , pain radiates to right upper arm and axilla .

## 2018-04-28 LAB — CBC
HEMATOCRIT: 45.3 % (ref 39.0–52.0)
Hemoglobin: 14.2 g/dL (ref 13.0–17.0)
MCH: 27.2 pg (ref 26.0–34.0)
MCHC: 31.3 g/dL (ref 30.0–36.0)
MCV: 86.8 fL (ref 78.0–100.0)
Platelets: 274 10*3/uL (ref 150–400)
RBC: 5.22 MIL/uL (ref 4.22–5.81)
RDW: 13.2 % (ref 11.5–15.5)
WBC: 6.7 10*3/uL (ref 4.0–10.5)

## 2018-04-28 LAB — BASIC METABOLIC PANEL
Anion gap: 14 (ref 5–15)
BUN: 13 mg/dL (ref 6–20)
CHLORIDE: 98 mmol/L (ref 98–111)
CO2: 24 mmol/L (ref 22–32)
Calcium: 9.9 mg/dL (ref 8.9–10.3)
Creatinine, Ser: 1.25 mg/dL — ABNORMAL HIGH (ref 0.61–1.24)
GFR calc Af Amer: >60 mL/min (ref >60)
GFR calc non Af Amer: >60 mL/min (ref >60)
GLUCOSE: 67 mg/dL — AB (ref 70–99)
POTASSIUM: 3.6 mmol/L (ref 3.5–5.1)
Sodium: 136 mmol/L (ref 135–145)

## 2018-04-28 MED ORDER — KETOROLAC TROMETHAMINE 30 MG/ML IJ SOLN
30.0000 mg | Freq: Once | INTRAMUSCULAR | Status: AC
  Administered 2018-04-28: 30 mg via INTRAMUSCULAR
  Filled 2018-04-28: qty 1

## 2018-04-28 MED ORDER — NAPROXEN 500 MG PO TABS: 500.0000 mg | ORAL_TABLET | Freq: Two times a day (BID) | ORAL | 0 refills | Status: DC

## 2018-04-28 NOTE — Discharge Instructions (Addendum)
You were seen today for chest pain.  Your work-up is reassuring.  Your heart testing and chest x-ray are normal.  Follow-up with your primary doctor.  Take naproxen twice daily as needed for pain.

## 2018-04-28 NOTE — ED Provider Notes (Signed)
[facility] Provider [facility]   CSN: [id number] Arrival date & time: 04/27/18  2000     History   Chief Complaint Chief Complaint  Patient presents with  . Chest Pain  . Arm Pain    HPI Harold Ayers is a 32 y.o. male.  HPI  This is a 32 year old male with a history of hypertension who presents with chest pain.  Patient reports 2-week history of fairly constant chest pain.  Reports pressure-like pain with shortness of breath.  No recent fevers or cough.  No nausea or diaphoresis.  He states that at times he has pain in the right arm and underneath his armpit.  Currently his pain is 4 out of 10.  Nothing seems to make the pain better or worse.  He took aspirin for the pain with minimal relief.  Denies any history of leg swelling, blood clots, recent hospitalization, recent surgery.  Denies any nausea, vomiting, fevers.  Past Medical History:  Diagnosis Date  . Anxiety   . GERD (gastroesophageal reflux disease)   . Hypertension   . Seizures (HCC)    pt reported last seizure was 5 years ago    Patient Active Problem List   Diagnosis Date Noted  . Gastroesophageal reflux disease 02/22/2017  . Abdominal pain, epigastric 02/22/2017  . Chest pain, atypical 02/22/2017    Past Surgical History:  Procedure Laterality Date  . COLONOSCOPY    . NO PAST SURGERIES    . WISDOM TOOTH EXTRACTION          Home Medications    Prior to Admission medications   Medication Sig Start Date End Date Taking? Authorizing Provider  lisinopril (PRINIVIL,ZESTRIL) 40 MG tablet Take 40 mg by mouth daily.    Yes [provider]  Omega-3 Fatty Acids (FISH OIL) 1000 MG CAPS Take 1 capsule by mouth daily.   Yes [provider]  omeprazole (PRILOSEC) 40 MG capsule Take 40 mg by mouth daily as needed (reflux).  01/15/17  Yes [provider]  naproxen (NAPROSYN) 500 MG tablet Take 1 tablet (500 mg total) by mouth 2 (two) times daily. 04/28/18    Keshana Klemz, Mayer Maskerourtney F, MD    Family History Family History  Problem Relation Age of Onset  . Hypertension Mother   . Diabetes Father   . Heart attack Father   . Drug abuse Father   . Healthy Sister   . Healthy Brother   . Heart attack Maternal Grandmother   . Heart disease Maternal Grandmother   . Diabetes Paternal Grandmother   . Diabetes Paternal Grandfather   . Kidney disease Paternal Uncle   . Diabetes Paternal Uncle   . Healthy Brother   . Colon cancer Neg Hx   . Esophageal cancer Neg Hx   . Pancreatic cancer Neg Hx   . Prostate cancer Neg Hx   . Rectal cancer Neg Hx   . Stomach cancer Neg Hx     Social History Social History   Tobacco Use  . Smoking status: Never Smoker  . Smokeless tobacco: Never Used  Substance Use Topics  . Alcohol use: Yes    Alcohol/week: 8.4 oz    Types: 14 Cans of beer per week    Comment: 1 or 2 a day  . Drug use: No     Allergies   Patient has no known allergies.   Review of Systems Review of Systems  Constitutional: Negative for fever.  Respiratory: Positive for shortness of  breath.   Cardiovascular: Positive for chest pain. Negative for leg swelling.  Gastrointestinal: Negative for abdominal pain, nausea and vomiting.  Genitourinary: Negative for dysuria.  Neurological: Negative for light-headedness.  All other systems reviewed and are negative.    Physical Exam Updated Vital Signs BP (!) 139/98   Pulse 65   Temp 98.9 F (37.2 C)   Resp 18   Ht 6\' 3"  (1.905 m)   Wt 108.9 kg (240 lb)   SpO2 98%   BMI 30.00 kg/m   Physical Exam  Constitutional: He is oriented to person, place, and time. He appears well-developed and well-nourished.  HENT:  Head: Normocephalic and atraumatic.  Eyes: Pupils are equal, round, and reactive to light.  Neck: Neck supple.  Cardiovascular: Normal rate, regular rhythm and normal heart sounds.  No murmur heard. TTP lower chest, no crepitus  Pulmonary/Chest: Effort normal and breath  sounds normal. No respiratory distress. He has no wheezes.  Abdominal: Soft. Bowel sounds are normal. There is no tenderness. There is no rebound.  Musculoskeletal: He exhibits no edema.       Right lower leg: He exhibits no tenderness and no edema.       Left lower leg: He exhibits no tenderness and no edema.  Lymphadenopathy:    He has no cervical adenopathy.  Neurological: He is alert and oriented to person, place, and time.  Skin: Skin is warm and dry.  Psychiatric: He has a normal mood and affect.  Nursing note and vitals reviewed.    ED Treatments / Results  Labs (all labs ordered are listed, but only abnormal results are displayed) Labs Reviewed  BASIC METABOLIC PANEL - Abnormal; Notable for the following components:      Result Value   Glucose, Bld 67 (*)    Creatinine, Ser 1.25 (*)    All other components within normal limits  CBC  I-STAT TROPONIN, ED    EKG EKG Interpretation  Date/Time:  Tuesday April 27 2018 20:12:34 EDT Ventricular Rate:  83 PR Interval:  152 QRS Duration: 78 QT Interval:  350 QTC Calculation: 411 R Axis:   58 Text Interpretation:  Normal sinus rhythm Normal ECG Confirmed by Ross Marcus ([id number]) on 04/28/2018 3:39:49 AM   Radiology Dg Chest 2 View  Result Date: 04/27/2018 CLINICAL DATA:  Chest pain. EXAM: CHEST - 2 VIEW COMPARISON:  04/26/2017 FINDINGS: Lungs are clear. Heart and mediastinum are within normal limits. Trachea is midline. No pleural effusions. Bone structures are unremarkable. IMPRESSION: No active cardiopulmonary disease. Electronically Signed   By: Richarda Overlie M.D.   On: 04/27/2018 21:18    Procedures Procedures (including critical care time)  Medications Ordered in ED Medications  ketorolac (TORADOL) 30 MG/ML injection 30 mg (30 mg Intramuscular Given 04/28/18 0418)     Initial Impression / Assessment and Plan / ED Course  I have reviewed the triage vital signs and the nursing notes.  Pertinent labs & imaging  results that were available during my care of the patient were reviewed by me and considered in my medical decision making (see chart for details).     Patient presents with chest pain.  Fairly atypical in nature.  Low risk otherwise.  EKG is normal without signs of ischemia.  Troponin is negative.  He has some reproducible pain on exam.  Chest x-ray shows no evidence of pneumothorax or pneumonia.  Patient was given IM Toradol.  Will trial anti-inflammatories at home.  He denies any recent fevers or infectious  symptoms.  Doubt pericarditis or myocarditis.  Follow-up with primary physician peer  After history, exam, and medical workup I feel the patient has been appropriately medically screened and is safe for discharge home. Pertinent diagnoses were discussed with the patient. Patient was given return precautions.   Final Clinical Impressions(s) / ED Diagnoses   Final diagnoses:  Chest wall pain    ED Discharge Orders        Ordered    naproxen (NAPROSYN) 500 MG tablet  2 times daily     04/28/18 0527       Aran Menning, Mayer Masker, MD 04/28/18 [phone]

## 2018-09-02 ENCOUNTER — Other Ambulatory Visit: Payer: Self-pay

## 2018-09-02 ENCOUNTER — Emergency Department (HOSPITAL_BASED_OUTPATIENT_CLINIC_OR_DEPARTMENT_OTHER)
Admission: EM | Admit: 2018-09-02 | Discharge: 2018-09-02 | Disposition: A | Discharge status: Home / Self Care | Payer: Managed Care, Other (non HMO) | Attending: Emergency Medicine | Admitting: Emergency Medicine

## 2018-09-02 ENCOUNTER — Encounter (HOSPITAL_BASED_OUTPATIENT_CLINIC_OR_DEPARTMENT_OTHER): Payer: Self-pay | Admitting: *Deleted

## 2018-09-02 DIAGNOSIS — R002 Palpitations: Secondary | ICD-10-CM | POA: Diagnosis present

## 2018-09-02 DIAGNOSIS — I10 Essential (primary) hypertension: Secondary | ICD-10-CM | POA: Insufficient documentation

## 2018-09-02 DIAGNOSIS — F419 Anxiety disorder, unspecified: Secondary | ICD-10-CM

## 2018-09-02 DIAGNOSIS — Z79899 Other long term (current) drug therapy: Secondary | ICD-10-CM | POA: Insufficient documentation

## 2018-09-02 MED ORDER — KETOROLAC TROMETHAMINE 15 MG/ML IJ SOLN
30.0000 mg | Freq: Once | INTRAMUSCULAR | Status: DC
Start: 2018-09-02 — End: 2018-09-02
  Filled 2018-09-02: qty 2

## 2018-09-02 MED ORDER — KETOROLAC TROMETHAMINE 30 MG/ML IJ SOLN
30.0000 mg | Freq: Once | INTRAMUSCULAR | Status: AC
Start: 2018-09-02 — End: 2018-09-02
  Administered 2018-09-02: 30 mg via INTRAMUSCULAR

## 2018-09-02 NOTE — ED Provider Notes (Signed)
[facility] EMERGENCY DEPARTMENT Provider Note   CSN: [id number] Arrival date & time: 09/02/18  1933   History   Chief Complaint Chief Complaint  Patient presents with  . Palpitations    HPI Harold Ayers is a 32 y.o. male presenting with intermittent palpitations for the past 4 hours. He also has a history of intermittent central chest pain but denies experiencing that recently. He states he has had this problem for the past year. He has discussed this with his PCP and seen cardiology Dr. Jacinto HalimGanji with a full cardiac work up without finding a cause for his symptoms. He reports he has high blood pressure and is on lisinopril. He is worried something is wrong with his heart, possibly a blood clot. He states prior physicians have given him NSAIDs with minimal improvement and heartburn medication with no improvement. He is frustrated by the lack of answers. He states he does feel anxiety about his health and that this may be contributing. He states last time he was in the ED he received toradol with improvement in symptoms. He denies SOB, nausea, diaphoresis.   HPI  Past Medical History:  Diagnosis Date  . Anxiety   . GERD (gastroesophageal reflux disease)   . Hypertension   . Seizures (HCC)    pt reported last seizure was 5 years ago    Patient Active Problem List   Diagnosis Date Noted  . Gastroesophageal reflux disease 02/22/2017  . Abdominal pain, epigastric 02/22/2017  . Chest pain, atypical 02/22/2017    Past Surgical History:  Procedure Laterality Date  . COLONOSCOPY    . NO PAST SURGERIES    . WISDOM TOOTH EXTRACTION          Home Medications    Prior to Admission medications   Medication Sig Start Date End Date Taking? Authorizing Provider  lisinopril (PRINIVIL,ZESTRIL) 40 MG tablet Take 40 mg by mouth daily.    Yes [provider]  Omega-3 Fatty Acids (FISH OIL) 1000 MG CAPS Take 1 capsule by mouth daily.   Yes [provider]    naproxen (NAPROSYN) 500 MG tablet Take 1 tablet (500 mg total) by mouth 2 (two) times daily. 04/28/18   Horton, Mayer Maskerourtney F, MD  omeprazole (PRILOSEC) 40 MG capsule Take 40 mg by mouth daily as needed (reflux).  01/15/17   [provider]    Family History Family History  Problem Relation Age of Onset  . Hypertension Mother   . Diabetes Father   . Heart attack Father   . Drug abuse Father   . Healthy Sister   . Healthy Brother   . Heart attack Maternal Grandmother   . Heart disease Maternal Grandmother   . Diabetes Paternal Grandmother   . Diabetes Paternal Grandfather   . Kidney disease Paternal Uncle   . Diabetes Paternal Uncle   . Healthy Brother   . Colon cancer Neg Hx   . Esophageal cancer Neg Hx   . Pancreatic cancer Neg Hx   . Prostate cancer Neg Hx   . Rectal cancer Neg Hx   . Stomach cancer Neg Hx     Social History Social History   Tobacco Use  . Smoking status: Never Smoker  . Smokeless tobacco: Never Used  Substance Use Topics  . Alcohol use: Yes    Alcohol/week: 14.0 standard drinks    Types: 14 Cans of beer per week    Comment: 1 or 2 a day  . Drug use: No  Allergies   Patient has no known allergies.   Review of Systems Review of Systems  Constitutional: Negative for activity change, appetite change, chills and fever.  HENT: Negative for congestion and sore throat.   Eyes: Negative for redness.  Respiratory: Negative for cough, shortness of breath and wheezing.   Cardiovascular: Positive for palpitations. Negative for chest pain.  Gastrointestinal: Negative for abdominal pain, constipation, diarrhea, nausea and vomiting.  Genitourinary: Negative for decreased urine volume and dysuria.  Musculoskeletal: Negative for back pain and neck pain.  Skin: Negative for rash.  Neurological: Negative for syncope, weakness and headaches.  Psychiatric/Behavioral: The patient is nervous/anxious.      Physical Exam Updated Vital Signs BP (!)  139/96   Pulse 83   Temp 98.1 F (36.7 C) (Oral)   Resp 15   Ht 6\' 3"  (1.905 m)   Wt 113.4 kg   SpO2 99%   BMI 31.25 kg/m   Physical Exam Vitals signs and nursing note reviewed.  Constitutional:      General: He is not in acute distress.    Appearance: Normal appearance. He is not ill-appearing, toxic-appearing or diaphoretic.  HENT:     Head: Normocephalic and atraumatic.     Mouth/Throat:     Mouth: Mucous membranes are moist.  Eyes:     Extraocular Movements: Extraocular movements intact.     Pupils: Pupils are equal, round, and reactive to light.  Neck:     Musculoskeletal: Normal range of motion and neck supple.  Cardiovascular:     Rate and Rhythm: Normal rate and regular rhythm.     Pulses: Normal pulses.     Heart sounds: Normal heart sounds.  Pulmonary:     Effort: Pulmonary effort is normal.     Breath sounds: Normal breath sounds.  Abdominal:     General: There is no distension.     Palpations: Abdomen is soft.     Tenderness: There is no abdominal tenderness.  Musculoskeletal: Normal range of motion.        General: No swelling.  Lymphadenopathy:     Cervical: No cervical adenopathy.  Skin:    General: Skin is warm and dry.  Neurological:     General: No focal deficit present.     Mental Status: He is alert and oriented to person, place, and time.  Psychiatric:        Mood and Affect: Mood normal.        Behavior: Behavior normal.      ED Treatments / Results  Labs (all labs ordered are listed, but only abnormal results are displayed) Labs Reviewed - No data to display  EKG EKG Interpretation  Date/Time:  Thursday September 02 2018 19:39:56 EST Ventricular Rate:  90 PR Interval:  146 QRS Duration: 74 QT Interval:  342 QTC Calculation: 418 R Axis:   63 Text Interpretation:  Normal sinus rhythm Normal ECG No significant change since last tracing Confirmed by Frederick Peers [phone]) on 09/02/2018 9:44:41 PM   Radiology No results  found.  Procedures Procedures (including critical care time)  Medications Ordered in ED Medications  ketorolac (TORADOL) 30 MG/ML injection 30 mg (30 mg Intramuscular Given 09/02/18 2235)     Initial Impression / Assessment and Plan / ED Course  I have reviewed the triage vital signs and the nursing notes.  Pertinent labs & imaging results that were available during my care of the patient were reviewed by me and considered in my medical decision making (see  chart for details).     32 year old with history of HTN, anxiety, GERD presenting with 4 hours of intermittent palpitations. He is well appearing on exam. Vital signs normal. EKG NSR. I had a long discussion with the patient providing education and reassurance. His anxiety is likely contributing strongly to his symptoms. I encouraged him to follow up with his PCP to discuss this and to be mindful of symptoms that may be caused by stress. He is stable for discharge home. Patient verbalized understanding and agreement with plan.   Final Clinical Impressions(s) / ED Diagnoses   Final diagnoses:  Anxiety  Palpitations    ED Discharge Orders    None       Tillman Sers, DO 09/02/18 2244    Little, Ambrose Finland, MD 09/04/18 1036

## 2018-09-02 NOTE — ED Triage Notes (Signed)
Palpitations x 2 hours. Hx of same.

## 2018-09-16 IMAGING — CR DG CHEST 2V
2 series · 2 of 2 positions shown · non-contrast
Comparison: PA and lateral chest 11/19/2016 and 06/18/2016.

CLINICAL DATA: Substernal chest pain this morning.

EXAM:
CHEST  2 VIEW

[chest pa]
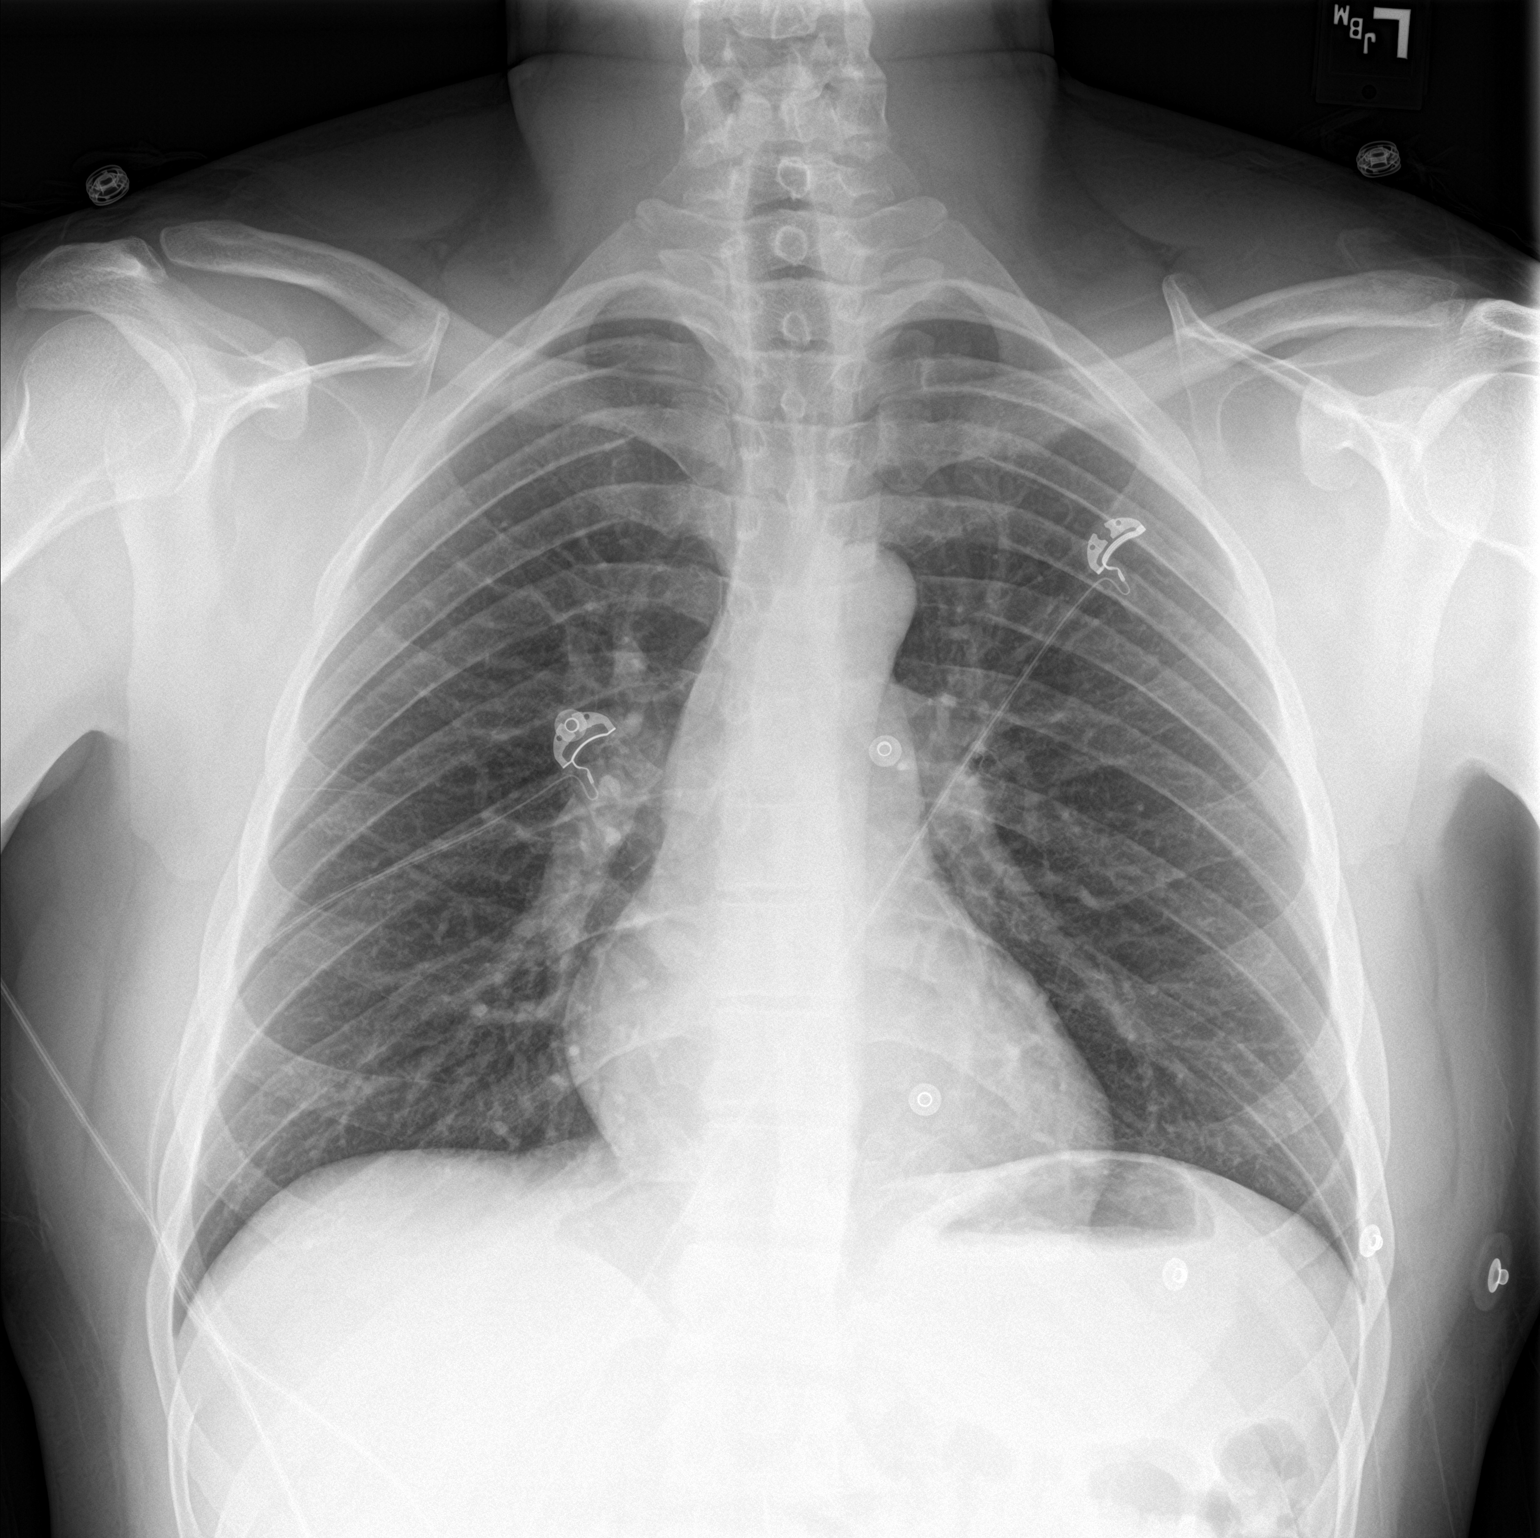

[chest lat]
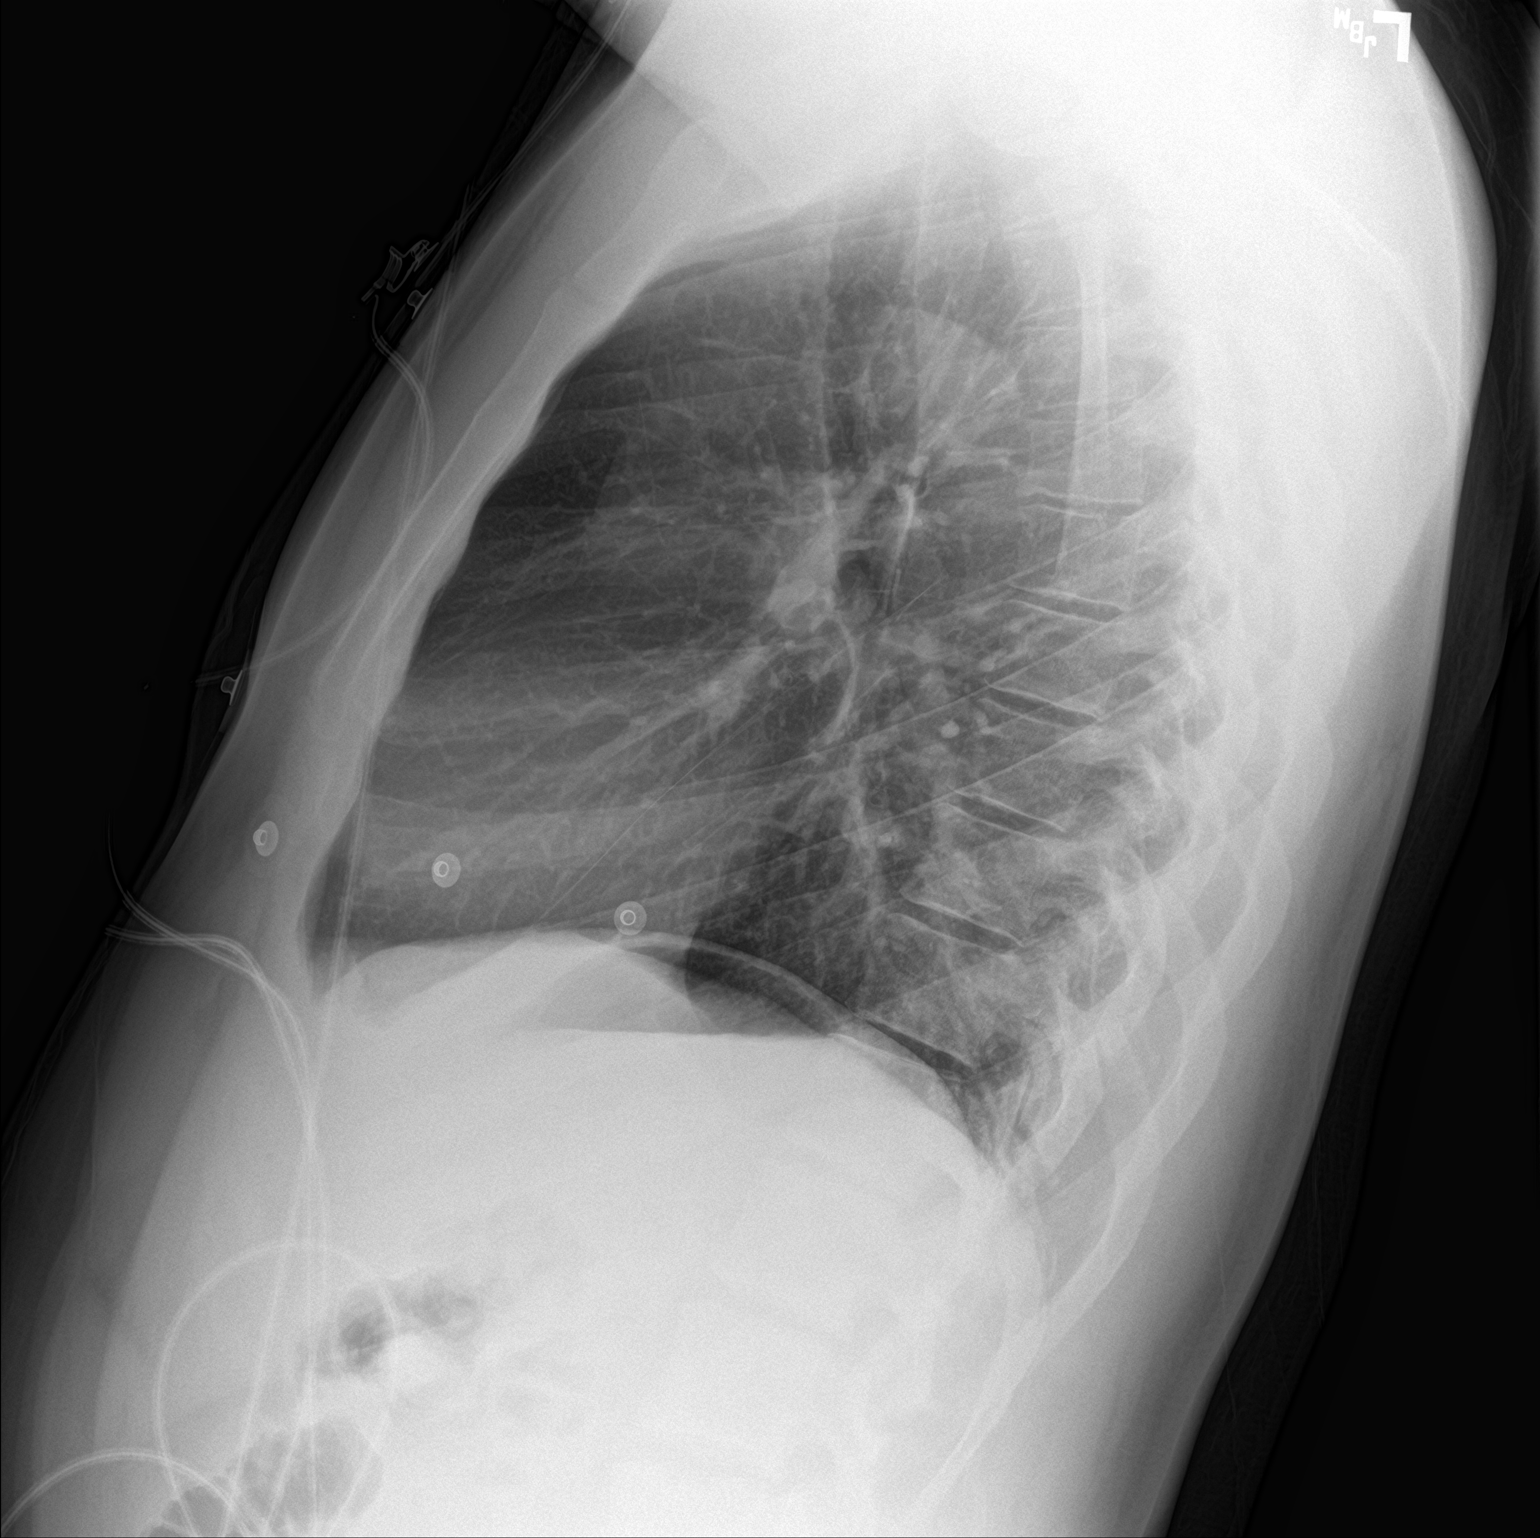

[2 of 2 positions shown; findings below may reference images not displayed]

FINDINGS: The lungs are clear. Heart size is normal. No pneumothorax or
pleural fluid. No bony abnormality.
IMPRESSION: Negative chest.

## 2019-08-31 ENCOUNTER — Ambulatory Visit: Payer: Self-pay | Admitting: Cardiology

## 2019-09-01 ENCOUNTER — Encounter: Payer: Self-pay | Admitting: Cardiology

## 2019-09-01 ENCOUNTER — Ambulatory Visit: Payer: Managed Care, Other (non HMO) | Admitting: Cardiology

## 2019-09-01 ENCOUNTER — Other Ambulatory Visit: Payer: Self-pay

## 2019-09-01 VITALS — BP 149/101 | HR 92 | Temp 98.2°F | Ht 75.0 in | Wt 252.9 lb

## 2019-09-01 DIAGNOSIS — Z8249 Family history of ischemic heart disease and other diseases of the circulatory system: Secondary | ICD-10-CM

## 2019-09-01 DIAGNOSIS — I10 Essential (primary) hypertension: Secondary | ICD-10-CM

## 2019-09-01 DIAGNOSIS — R0789 Other chest pain: Secondary | ICD-10-CM

## 2019-09-01 MED ORDER — AMLODIPINE BESYLATE 5 MG PO TABS: 5.0000 mg | ORAL_TABLET | Freq: Every day | ORAL | 2 refills | Status: DC

## 2019-09-01 NOTE — Progress Notes (Signed)
Primary Physician:  Camille Bal, PA-C   Patient ID: Harold Ayers, male    DOB: [birth year], 33 y.o.   MRN: [id number]  Subjective:    Chief Complaint  Patient presents with  . Chest Pain  . Follow-up    HPI: Harold Ayers  is a 33 y.o. male  with history of seizure disorder, hypertension, anxiety, last seen by [place] in July 2018 with ongoing chest pain and palpitations.  He wore 24-hour Holter monitor that showed rare PACs and PVCs.  He is now referred back to [place] by his PCP for chest pain.  Patient reports that he has had continued chest pain since 2018; however, seems to be more severe over the last several weeks.  States that chest pain is there approximately 85% of the time.  No correlation between certain movements or exertion. Denies any shortness of breath.  He is on lisinopril for management of hypertension, states his blood pressure has continued to be uncontrolled.  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  Has family history of heart disease at age 31 in his father. No tobacco use.   Past Medical History:  Diagnosis Date  . Anxiety   . GERD (gastroesophageal reflux disease)   . Hypertension   . Seizures ([place])    pt reported last seizure was 5 years ago    Past Surgical History:  Procedure Laterality Date  . COLONOSCOPY    . NO PAST SURGERIES    . WISDOM TOOTH EXTRACTION      Social History   Socioeconomic History  . Marital status: Single    Spouse name: Not on file  . Number of children: 3  . Years of education: Not on file  . Highest education level: Not on file  Occupational History  . Occupation: [employer]  Tobacco Use  . Smoking status: Never Smoker  . Smokeless tobacco: Never Used  Substance and Sexual Activity  . Alcohol use: Yes    Alcohol/week: 14.0 standard drinks    Types: 14 Cans of beer per  week    Comment: 1 or 2 a day  . Drug use: No  . Sexual activity: Yes  Other Topics Concern  . Not on file  Social History Narrative  . Not on file   Social Determinants of Health   Financial Resource Strain:   . Difficulty of Paying Living Expenses: Not on file  Food Insecurity:   . Worried About Charity fundraiser in the Last Year: Not on file  . Ran Out of Food in the Last Year: Not on file  Transportation Needs:   . Lack of Transportation (Medical): Not on file  . Lack of Transportation (Non-Medical): Not on file  Physical Activity:   . Days of Exercise per Week: Not on file  . Minutes of Exercise per Session: Not on file  Stress:   . Feeling of Stress : Not on file  Social Connections:   . Frequency of Communication with Friends and Family: Not on file  . Frequency of Social Gatherings with Friends and Family: Not on file  . Attends Religious Services: Not on file  . Active Member of Clubs or Organizations: Not on file  . Attends Archivist Meetings: Not on file  . Marital Status: Not on file  Intimate Partner Violence:   .  Fear of Current or Ex-Partner: Not on file  . Emotionally Abused: Not on file  . Physically Abused: Not on file  . Sexually Abused: Not on file    Review of Systems  Constitution: Negative for decreased appetite, malaise/fatigue, weight gain and weight loss.  Eyes: Negative for visual disturbance.  Cardiovascular: Positive for chest pain. Negative for claudication, dyspnea on exertion, leg swelling, orthopnea, palpitations and syncope.  Respiratory: Negative for hemoptysis and wheezing.   Endocrine: Negative for cold intolerance and heat intolerance.  Hematologic/Lymphatic: Does not bruise/bleed easily.  Skin: Negative for nail changes.  Musculoskeletal: Negative for muscle weakness and myalgias.  Gastrointestinal: Negative for abdominal pain, change in bowel habit, nausea and vomiting.  Neurological: Negative for difficulty with  concentration, dizziness, focal weakness and headaches.  Psychiatric/Behavioral: Negative for altered mental status and suicidal ideas.  All other systems reviewed and are negative.     Objective:  Blood pressure (!) 149/101, pulse 92, temperature 98.2 F (36.8 C), height '6\' 3"'  (1.905 m), weight 252 lb 14.4 oz (114.7 kg), SpO2 100 %. Body mass index is 31.61 kg/m.    Physical Exam  Constitutional: He is oriented to person, place, and time. Vital signs are normal. He appears well-developed and well-nourished.  HENT:  Head: Normocephalic and atraumatic.  Cardiovascular: Normal rate, regular rhythm, normal heart sounds and intact distal pulses.  Pulmonary/Chest: Effort normal and breath sounds normal. No accessory muscle usage. No respiratory distress.  Abdominal: Soft. Bowel sounds are normal.  Musculoskeletal:        General: Normal range of motion.     Cervical back: Normal range of motion.  Neurological: He is alert and oriented to person, place, and time.  Skin: Skin is warm and dry.  Vitals reviewed.  Radiology: No results found.  Laboratory examination:    CMP Latest Ref Rng & Units 04/27/2018 04/26/2017 11/19/2016  Glucose 70 - 99 mg/dL 67(L) 108(H) 96  BUN 6 - 20 mg/dL '13 14 10  ' Creatinine 0.61 - 1.24 mg/dL 1.25(H) 1.24 1.04  Sodium 135 - 145 mmol/L 136 137 136  Potassium 3.5 - 5.1 mmol/L 3.6 4.2 4.4  Chloride 98 - 111 mmol/L 98 103 98(L)  CO2 22 - 32 mmol/L '24 25 26  ' Calcium 8.9 - 10.3 mg/dL 9.9 9.3 9.8  Total Protein 6.5 - 8.1 g/dL - - -  Total Bilirubin 0.3 - 1.2 mg/dL - - -  Alkaline Phos 38 - 126 U/L - - -  AST 15 - 41 U/L - - -  ALT 17 - 63 U/L - - -   CBC Latest Ref Rng & Units 04/27/2018 04/26/2017 11/19/2016  WBC 4.0 - 10.5 K/uL 6.7 5.2 4.1  Hemoglobin 13.0 - 17.0 g/dL 14.2 13.2 14.1  Hematocrit 39.0 - 52.0 % 45.3 40.8 43.6  Platelets 150 - 400 K/uL 274 261 231   Lipid Panel  No results found for: CHOL, TRIG, HDL, CHOLHDL, VLDL, LDLCALC, LDLDIRECT HEMOGLOBIN  A1C No results found for: HGBA1C, MPG TSH No results for input(s): TSH in the last 8760 hours.  PRN Meds:. There are no discontinued medications. Current Meds  Medication Sig  . lisinopril (PRINIVIL,ZESTRIL) 40 MG tablet Take 40 mg by mouth daily.   . naproxen (NAPROSYN) 500 MG tablet Take 1 tablet (500 mg total) by mouth 2 (two) times daily. (Patient taking differently: Take 500 mg by mouth as needed. )  . Omega-3 Fatty Acids (FISH OIL) 1000 MG CAPS Take 1 capsule by mouth daily.  Marland Kitchen  omeprazole (PRILOSEC) 40 MG capsule Take 40 mg by mouth daily as needed (reflux).    Current Facility-Administered Medications for the 09/01/19 encounter (Office Visit) with Miquel Dunn, NP  Medication  . 0.9 %  sodium chloride infusion    Cardiac Studies:    Holter Monitor 24 hours 11/20/2016: Slowest heart rate at 11:22 AM, 53 bpm. Maximum heart rate 128 bpm at 7:44 AM. Rare ABCs and PACs. One episode of palpitations, symptomatic reveals normal sinus rhythm.  Treadmill exercise stress test 04/03/2017: Indication: CP Resting EKG demonstrates NSR. The patient exercised according to Bruce Protocol, Total time recorded 10:30 min achieving max heart rate of 194 which was 102% of THR for age and 12.59 METS of work. Stress terminated due to THR (>85% MPHR)/MPHR met. Hypertensive at baseline. Normal BP response. There was no ST-T changes of ischemia with exercise stress test. There were no significant arrhythmias.  Rec: No e/o ischemia by GXT. Exercise tolerence is excellent. Hypertensive at baseline. Normal BP response. Peak BP 184/101 mm Hg.Needs BP management. Continue Preventive therapy.  Assessment:   Chest pain, atypical - Plan: EKG 12-Lead, PCV ECHOCARDIOGRAM COMPLETE, CT CARDIAC SCORING, amLODipine (NORVASC) 5 MG tablet  Primary hypertension  Family history of early CAD  EKG 09/01/2019: Normal sinus rhythm at 74 bpm, normal axis, early repolarization abnormality. No evidence of  ischemia.  Recommendations:   Patient is here for follow-up for chest pain.  He made an appointment to see [place] as his chest pain that he has had since 2018 has recently intensified.  States that chest pain is there 85% of the day not related to exertion or certain movements.  He has had negative treadmill stress testing in 2018. Physical exam and EKG are unremarkable.  We will let his symptoms of chest discomfort are likely noncardiac related.  I will obtain echocardiogram to exclude any structural abnormalities.  His blood pressure is markedly elevated, I will add amlodipine 5 mg daily both for hypertension and chest pain.  Given his family history of early CAD, he would benefit from cardiac calcium score CT for risk stratification.  I have encouraged him to follow low-sodium diet and start regular exercise.  I will see him back after the test for further recommendations and reevaluation.  Miquel Dunn, MSN, APRN, FNP-C [facility]. [place] Office: [phone] Fax: [phone]

## 2019-09-19 ENCOUNTER — Ambulatory Visit (INDEPENDENT_AMBULATORY_CARE_PROVIDER_SITE_OTHER): Payer: Managed Care, Other (non HMO)

## 2019-09-19 ENCOUNTER — Other Ambulatory Visit: Payer: Self-pay

## 2019-09-19 DIAGNOSIS — R0789 Other chest pain: Secondary | ICD-10-CM

## 2019-09-20 NOTE — Progress Notes (Signed)
Vmailbox is full and cannot accept any messages at this time.

## 2019-09-29 ENCOUNTER — Encounter: Payer: Self-pay | Admitting: Cardiology

## 2019-09-29 ENCOUNTER — Ambulatory Visit: Payer: Managed Care, Other (non HMO) | Admitting: Cardiology

## 2019-09-29 ENCOUNTER — Other Ambulatory Visit: Payer: Self-pay

## 2019-09-29 VITALS — BP 126/88 | HR 85 | Ht 75.0 in | Wt 251.6 lb

## 2019-09-29 DIAGNOSIS — Z8249 Family history of ischemic heart disease and other diseases of the circulatory system: Secondary | ICD-10-CM | POA: Diagnosis not present

## 2019-09-29 DIAGNOSIS — R0789 Other chest pain: Secondary | ICD-10-CM | POA: Diagnosis not present

## 2019-09-29 DIAGNOSIS — I10 Essential (primary) hypertension: Secondary | ICD-10-CM

## 2019-09-29 NOTE — Progress Notes (Signed)
Primary Physician:  Camille Bal, PA-C   Patient ID: Harold Ayers, male    DOB: [birth year], 34 y.o.   MRN: [id number]  Subjective:    Chief Complaint  Patient presents with  . Hypertension  . Results    echo, cardiac score CT  . Follow-up    4wk    HPI: Harold Ayers  is a 34 y.o. male  with history of seizure disorder, hypertension, anxiety, with ongoing chest pain and palpitations.  He wore 24-hour Holter monitor that showed rare PACs and PVCs.    Due to continued chest pain and hypertension, he underwent echocardiogram.  He was also scheduled for calcium score CT for further risk assessment; however, has not had this performed as of yet.  He now presents to discuss echocardiogram results.  Symptoms are unchanged since last seen by me.  His blood pressure has been better controlled with amlodipine.  He does question if potentially causing erectile dysfunction.  Patient reports that he has had continued chest pain since 2018; however, seems to be more severe over the last several weeks.  States that chest pain is there approximately 85% of the time.  No correlation between certain movements or exertion. Denies any shortness of breath. 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  Has family history of heart disease at age 13 in his father. No tobacco use.   Past Medical History:  Diagnosis Date  . Anxiety   . GERD (gastroesophageal reflux disease)   . Hypertension   . Seizures ([place])    pt reported last seizure was 5 years ago    Past Surgical History:  Procedure Laterality Date  . COLONOSCOPY    . NO PAST SURGERIES    . WISDOM TOOTH EXTRACTION      Social History   Socioeconomic History  . Marital status: Single    Spouse name: Not on file  . Number of children: 3  . Years of education: Not on file  . Highest  education level: Not on file  Occupational History  . Occupation: [employer]  Tobacco Use  . Smoking status: Never Smoker  . Smokeless tobacco: Never Used  Substance and Sexual Activity  . Alcohol use: Yes    Alcohol/week: 14.0 standard drinks    Types: 14 Cans of beer per week    Comment: 1 or 2 a day  . Drug use: No  . Sexual activity: Yes  Other Topics Concern  . Not on file  Social History Narrative  . Not on file   Social Determinants of Health   Financial Resource Strain:   . Difficulty of Paying Living Expenses: Not on file  Food Insecurity:   . Worried About Charity fundraiser in the Last Year: Not on file  . Ran Out of Food in the Last Year: Not on file  Transportation Needs:   . Lack of Transportation (Medical): Not on file  . Lack of Transportation (Non-Medical): Not on file  Physical Activity:   . Days of Exercise per Week: Not on file  . Minutes of Exercise per Session: Not on file  Stress:   . Feeling of Stress : Not on file  Social Connections:   . Frequency of Communication with Friends and Family: Not on file  . Frequency of Social Gatherings with Friends and  Family: Not on file  . Attends Religious Services: Not on file  . Active Member of Clubs or Organizations: Not on file  . Attends Archivist Meetings: Not on file  . Marital Status: Not on file  Intimate Partner Violence:   . Fear of Current or Ex-Partner: Not on file  . Emotionally Abused: Not on file  . Physically Abused: Not on file  . Sexually Abused: Not on file    Review of Systems  Constitution: Negative for decreased appetite, malaise/fatigue, weight gain and weight loss.  Eyes: Negative for visual disturbance.  Cardiovascular: Positive for chest pain. Negative for claudication, dyspnea on exertion, leg swelling, orthopnea, palpitations and syncope.  Respiratory: Negative for hemoptysis and wheezing.   Endocrine: Negative for cold intolerance and heat intolerance.    Hematologic/Lymphatic: Does not bruise/bleed easily.  Skin: Negative for nail changes.  Musculoskeletal: Negative for muscle weakness and myalgias.  Gastrointestinal: Negative for abdominal pain, change in bowel habit, nausea and vomiting.  Neurological: Negative for difficulty with concentration, dizziness, focal weakness and headaches.  Psychiatric/Behavioral: Negative for altered mental status and suicidal ideas.  All other systems reviewed and are negative.     Objective:  Blood pressure 126/88, pulse 85, height '6\' 3"'  (1.905 m), weight 251 lb 9.6 oz (114.1 kg), SpO2 97 %. Body mass index is 31.45 kg/m.    Physical Exam  Constitutional: He is oriented to person, place, and time. Vital signs are normal. He appears well-developed and well-nourished.  HENT:  Head: Normocephalic and atraumatic.  Cardiovascular: Normal rate, regular rhythm, normal heart sounds and intact distal pulses.  Pulmonary/Chest: Effort normal and breath sounds normal. No accessory muscle usage. No respiratory distress.  Abdominal: Soft. Bowel sounds are normal.  Musculoskeletal:        General: Normal range of motion.     Cervical back: Normal range of motion.  Neurological: He is alert and oriented to person, place, and time.  Skin: Skin is warm and dry.  Vitals reviewed.  Radiology: No results found.  Laboratory examination:    CMP Latest Ref Rng & Units 04/27/2018 04/26/2017 11/19/2016  Glucose 70 - 99 mg/dL 67(L) 108(H) 96  BUN 6 - 20 mg/dL '13 14 10  ' Creatinine 0.61 - 1.24 mg/dL 1.25(H) 1.24 1.04  Sodium 135 - 145 mmol/L 136 137 136  Potassium 3.5 - 5.1 mmol/L 3.6 4.2 4.4  Chloride 98 - 111 mmol/L 98 103 98(L)  CO2 22 - 32 mmol/L '24 25 26  ' Calcium 8.9 - 10.3 mg/dL 9.9 9.3 9.8  Total Protein 6.5 - 8.1 g/dL - - -  Total Bilirubin 0.3 - 1.2 mg/dL - - -  Alkaline Phos 38 - 126 U/L - - -  AST 15 - 41 U/L - - -  ALT 17 - 63 U/L - - -   CBC Latest Ref Rng & Units 04/27/2018 04/26/2017 11/19/2016  WBC 4.0 -  10.5 K/uL 6.7 5.2 4.1  Hemoglobin 13.0 - 17.0 g/dL 14.2 13.2 14.1  Hematocrit 39.0 - 52.0 % 45.3 40.8 43.6  Platelets 150 - 400 K/uL 274 261 231   Lipid Panel  No results found for: CHOL, TRIG, HDL, CHOLHDL, VLDL, LDLCALC, LDLDIRECT HEMOGLOBIN A1C No results found for: HGBA1C, MPG TSH No results for input(s): TSH in the last 8760 hours.  PRN Meds:. There are no discontinued medications. Current Meds  Medication Sig  . amLODipine (NORVASC) 5 MG tablet Take 1 tablet (5 mg total) by mouth daily.  Marland Kitchen lisinopril (PRINIVIL,ZESTRIL)  40 MG tablet Take 40 mg by mouth daily.   . naproxen (NAPROSYN) 500 MG tablet Take 1 tablet (500 mg total) by mouth 2 (two) times daily. (Patient taking differently: Take 500 mg by mouth as needed. )  . omeprazole (PRILOSEC) 40 MG capsule Take 40 mg by mouth daily as needed (reflux).    Current Facility-Administered Medications for the 09/29/19 encounter (Office Visit) with Miquel Dunn, NP  Medication  . 0.9 %  sodium chloride infusion    Cardiac Studies:   Echocardiogram 09/19/2019: Normal LV systolic function with EF 55%. Moderate concentric hypertrophy of the left ventricle. Normal global wall motion. Left ventricle cavity is normal in size. Normal diastolic filling pattern.  Left atrial cavity is mildly dilated. 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 Mild to moderate mitral regurgitation. IVC is dilated with blunted respiratory response. This may suggest elevated right heart pressure.   Holter Monitor 24 hours 11/20/2016: Slowest heart rate at 11:22 AM, 53 bpm. Maximum heart rate 128 bpm at 7:44 AM. Rare ABCs and PACs. One episode of palpitations, symptomatic reveals normal sinus rhythm.  Treadmill exercise stress test 04/03/2017: Indication: CP Resting EKG demonstrates NSR. The patient exercised according to [facility] Protocol, Total time recorded 10:30 min achieving max heart rate of 194 which was  102% of THR for age and 12.59 METS of work. Stress terminated due to THR (>85% MPHR)/MPHR met. Hypertensive at baseline. Normal BP response. There was no ST-T changes of ischemia with exercise stress test. There were no significant arrhythmias.  Rec: No e/o ischemia by GXT. Exercise tolerence is excellent. Hypertensive at baseline. Normal BP response. Peak BP 184/101 mm Hg.Needs BP management. Continue Preventive therapy.  Assessment:   Chest pain, atypical  Primary hypertension  Family history of early CAD  EKG 09/01/2019: Normal sinus rhythm at 74 bpm, normal axis, early repolarization abnormality. No evidence of ischemia.  Recommendations:   I have discussed recent echocardiogram results that shows normal LVEF.  I suspect moderate LVH and left atrial dilation related to uncontrolled hypertension.  His blood pressure is better controlled now with amlodipine.  He did have some slight improvement in chest pain with amlodipine, but continues to mostly still have the symptoms that are unchanged.  I recommended we continue with obtaining calcium score CT for further risk assessment.  I do suspect that his chest pain is of cardiac etiology as it has been present for several years. Hopefully if contributed by blood pressure, this will improve with better blood pressure control. I would recommend continued efforts of primary and secondary prevention. We have discussed dietary changes as well as regular exercise.   He has had one episode of ED that he felt may be related to amlodipine. I have discussed that this is not a usual side effect of amlodipine. His blood pressure has significantly improved with the medication. I have recommended that we watch and see if this continues. If so, we can potentially stop the medication to see if truly related. I will schedule him for a virtual visit in 2 weeks to discuss his calcium score CT and make further recommendations at that time.   Miquel Dunn, MSN, APRN, FNP-C [facility]. [place] Office: [phone] Fax: [phone]

## 2019-10-13 ENCOUNTER — Telehealth: Payer: Managed Care, Other (non HMO) | Admitting: Cardiology

## 2019-10-19 ENCOUNTER — Encounter: Payer: Self-pay | Admitting: Cardiology

## 2019-10-19 ENCOUNTER — Telehealth (INDEPENDENT_AMBULATORY_CARE_PROVIDER_SITE_OTHER): Payer: Managed Care, Other (non HMO) | Admitting: Cardiology

## 2019-10-19 ENCOUNTER — Telehealth: Payer: Managed Care, Other (non HMO) | Admitting: Cardiology

## 2019-10-19 ENCOUNTER — Other Ambulatory Visit: Payer: Self-pay

## 2019-10-19 DIAGNOSIS — I10 Essential (primary) hypertension: Secondary | ICD-10-CM

## 2019-10-19 DIAGNOSIS — R0789 Other chest pain: Secondary | ICD-10-CM | POA: Diagnosis not present

## 2019-10-19 DIAGNOSIS — N529 Male erectile dysfunction, unspecified: Secondary | ICD-10-CM

## 2019-10-19 MED ORDER — SILDENAFIL CITRATE 25 MG PO TABS: 25.0000 mg | ORAL_TABLET | Freq: Every day | ORAL | 1 refills | Status: AC | PRN

## 2019-10-19 MED ORDER — CHLORTHALIDONE 25 MG PO TABS: 25.0000 mg | ORAL_TABLET | Freq: Every day | ORAL | 1 refills | Status: DC

## 2019-10-19 NOTE — Progress Notes (Signed)
Primary Physician:  Aurea Graff, PA-C   Patient ID: Harold Ayers, male    DOB: 03-Aug-1986, 34 y.o.   MRN: [id number]  Subjective:    Chief Complaint  Patient presents with  . Chest Pain    2 week Follow up    This visit type was conducted due to national recommendations for restrictions regarding the COVID-19 Pandemic (e.g. social distancing).  This format is felt to be most appropriate for this patient at this time.  All issues noted in this document were discussed and addressed.  No physical exam was performed (except for noted visual exam findings with Telehealth visits).  The patient has consented to conduct a Telehealth visit and understands insurance will be billed.   I discussed the limitations of evaluation and management by telemedicine and the availability of in person appointments. The patient expressed understanding and agreed to proceed.  Virtual Visit via Video Note is as below  I connected with@, on 10/19/2019 at 1630 by telephone and verified that I am speaking with the correct person using two identifiers. Unable to perform video visit as patient did not have equipment.    I have discussed with the patient regarding the safety during COVID Pandemic and steps and precautions including social distancing with the patient.    HPI: Harold Ayers  is a 34 y.o. male  with history of seizure disorder, hypertension, anxiety, with ongoing chest pain and palpitations.  He wore 24-hour Holter monitor that showed rare PACs and PVCs.    Due to continued chest pain and hypertension, he underwent echocardiogram on 09/19/2019 revealing normal LVEF with moderate LVH.  He recently underwent calcium score CT and this is a telephone appointment to discuss the results.  Patient reports that his chest pain has improved since last seen by me.  He is tolerating medications well.  At his last office visit, he had complained of some erectile dysfunction that he felt may have been related to  amlodipine; however, today he states that his erectile dysfunction has actually been an ongoing issue for 5 to 6 months.  He was embarrassed to tell me this at his last office visit.  He states his blood pressure has been better controlled with the medication, but still slightly elevated.  Has family history of heart disease at age 1 in his father. No tobacco use.  He is trying to exercise regularly and states that he is now running 2 to 3 miles and tolerating this well.  Past Medical History:  Diagnosis Date  . Anxiety   . GERD (gastroesophageal reflux disease)   . Hypertension   . Seizures ([place])    pt reported last seizure was 5 years ago    Past Surgical History:  Procedure Laterality Date  . COLONOSCOPY    . NO PAST SURGERIES    . WISDOM TOOTH EXTRACTION      Social History   Socioeconomic History  . Marital status: Single    Spouse name: Not on file  . Number of children: 3  . Years of education: Not on file  . Highest education level: Not on file  Occupational History  . Occupation: [employer]  Tobacco Use  . Smoking status: Never Smoker  . Smokeless tobacco: Never Used  Substance and Sexual Activity  . Alcohol use: Yes    Alcohol/week: 14.0 standard drinks    Types: 14 Cans of beer per week    Comment: 1 or 2 a day  . Drug use:  No  . Sexual activity: Yes  Other Topics Concern  . Not on file  Social History Narrative  . Not on file   Social Determinants of Health   Financial Resource Strain:   . Difficulty of Paying Living Expenses: Not on file  Food Insecurity:   . Worried About Charity fundraiser in the Last Year: Not on file  . Ran Out of Food in the Last Year: Not on file  Transportation Needs:   . Lack of Transportation (Medical): Not on file  . Lack of Transportation (Non-Medical): Not on file  Physical Activity:   . Days of Exercise per Week: Not on file  . Minutes of Exercise per Session: Not on file  Stress:   . Feeling of Stress : Not on file   Social Connections:   . Frequency of Communication with Friends and Family: Not on file  . Frequency of Social Gatherings with Friends and Family: Not on file  . Attends Religious Services: Not on file  . Active Member of Clubs or Organizations: Not on file  . Attends Archivist Meetings: Not on file  . Marital Status: Not on file  Intimate Partner Violence:   . Fear of Current or Ex-Partner: Not on file  . Emotionally Abused: Not on file  . Physically Abused: Not on file  . Sexually Abused: Not on file   Review of Systems  Eyes: Negative for blurred vision.  Respiratory: Negative for shortness of breath.   Cardiovascular: Positive for chest pain (improved). Negative for leg swelling.  Genitourinary:       Erectile dysfunction  Neurological: Negative for dizziness.       Objective:  There were no vitals taken for this visit. There is no height or weight on file to calculate BMI.    Physical exam not performed or limited due to virtual visit.   Please see exam details from prior visit is as below.   Physical Exam  Constitutional: He is oriented to person, place, and time. Vital signs are normal. He appears well-developed and well-nourished.  HENT:  Head: Normocephalic and atraumatic.  Cardiovascular: Normal rate, regular rhythm, normal heart sounds and intact distal pulses.  Pulmonary/Chest: Effort normal and breath sounds normal. No accessory muscle usage. No respiratory distress.  Abdominal: Soft. Bowel sounds are normal.  Musculoskeletal:        General: Normal range of motion.     Cervical back: Normal range of motion.  Neurological: He is alert and oriented to person, place, and time.  Skin: Skin is warm and dry.  Vitals reviewed.  Radiology: No results found.  Laboratory examination:    CMP Latest Ref Rng & Units 04/27/2018 04/26/2017 11/19/2016  Glucose 70 - 99 mg/dL 67(L) 108(H) 96  BUN 6 - 20 mg/dL _0 Creatinine 0.61 - 1.24 mg/dL 1.25(H)  1.24 1.04  Sodium 135 - 145 mmol/L 136 137 136  Potassium 3.5 - 5.1 mmol/L 3.6 4.2 4.4  Chloride 98 - 111 mmol/L 98 103 98(L)  CO2 22 - 32 mmol/L _1 Calcium 8.9 - 10.3 mg/dL 9.9 9.3 9.8  Total Protein 6.5 - 8.1 g/dL - - -  Total Bilirubin 0.3 - 1.2 mg/dL - - -  Alkaline Phos 38 - 126 U/L - - -  AST 15 - 41 U/L - - -  ALT 17 - 63 U/L - - -   CBC Latest Ref Rng & Units 04/27/2018 04/26/2017 11/19/2016  WBC  4.0 - 10.5 K/uL 6.7 5.2 4.1  Hemoglobin 13.0 - 17.0 g/dL 14.2 13.2 14.1  Hematocrit 39.0 - 52.0 % 45.3 40.8 43.6  Platelets 150 - 400 K/uL 274 261 231   Lipid Panel  No results found for: CHOL, TRIG, HDL, CHOLHDL, VLDL, LDLCALC, LDLDIRECT HEMOGLOBIN A1C No results found for: HGBA1C, MPG TSH No results for input(s): TSH in the last 8760 hours.  PRN Meds:. Medications Discontinued During This Encounter  Medication Reason  . naproxen (NAPROSYN) 500 MG tablet Error   Current Meds  Medication Sig  . amLODipine (NORVASC) 5 MG tablet Take 1 tablet (5 mg total) by mouth daily.  Marland Kitchen lisinopril (PRINIVIL,ZESTRIL) 40 MG tablet Take 40 mg by mouth daily.   Marland Kitchen omeprazole (PRILOSEC) 40 MG capsule Take 40 mg by mouth daily as needed (reflux).    Current Facility-Administered Medications for the 10/19/19 encounter (Telemedicine) with Miquel Dunn, NP  Medication  . 0.9 %  sodium chloride infusion    Cardiac Studies:   Calcium score CT 10/05/2019:   TOTAL CALCIUM SCORE :   0  CARDIAC ANATOMY:Normal.  VISUALIZED THORAX:Small calcified subcarinal lymph node.  IMPRESSION: The total coronary calcium score is 0.  Echocardiogram 09/19/2019: Normal LV systolic function with EF 55%. Moderate concentric hypertrophy of the left ventricle. Normal global wall motion. Left ventricle cavity is normal in size. Normal diastolic filling pattern.  Left atrial cavity is mildly dilated. 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 Mild to moderate  mitral regurgitation. IVC is dilated with blunted respiratory response. This may suggest elevated right heart pressure.   Holter Monitor 24 hours 11/20/2016: Slowest heart rate at 11:22 AM, 53 bpm. Maximum heart rate 128 bpm at 7:44 AM. Rare ABCs and PACs. One episode of palpitations, symptomatic reveals normal sinus rhythm.  Treadmill exercise stress test 04/03/2017: Indication: CP Resting EKG demonstrates NSR. The patient exercised according to Bruce Protocol, Total time recorded 10:30 min achieving max heart rate of 194 which was 102% of THR for age and 12.59 METS of work. Stress terminated due to THR (>85% MPHR)/MPHR met. Hypertensive at baseline. Normal BP response. There was no ST-T changes of ischemia with exercise stress test. There were no significant arrhythmias.  Rec: No e/o ischemia by GXT. Exercise tolerence is excellent. Hypertensive at baseline. Normal BP response. Peak BP 184/101 mm Hg.Needs BP management. Continue Preventive therapy.  Assessment:   Primary hypertension - Plan: Basic metabolic panel  Chest pain, atypical  Erectile dysfunction, unspecified erectile dysfunction type  EKG 09/01/2019: Normal sinus rhythm at 74 bpm, normal axis, early repolarization abnormality. No evidence of ischemia.  Recommendations:   I have discussed recent CT calcium score test with the patient, calcium score is 0.  I have reassured him.  His chest pain has significantly improved since last seen by me.  Low suspicion for cardiac etiology given his previous cardiac work-up.  Is chest pain could potentially be related to hypertension, GERD, or musculoskeletal etiology.  Would recommend continued risk factor modification given his family history of early CAD.  Although his blood pressure has improved, it does continue to be elevated.  I will add chlorthalidone 25 mg daily.  Will check BMP in 2 weeks for surveillance of kidney function.  Continue with lisinopril and amlodipine.  He is  requesting prescription for help with his erectile dysfunction.  He admits today that this has been an ongoing issue for the last 5 to 6 months.  Given his young  age, do feel that he will need urology evaluation.  We will refer to urology for evaluation.  Question if his history of uncontrolled hypertension has potentially precipitated his erectile dysfunction.  I have prescribed Viagra 25 mg as needed as I do not see any cardiac contraindications at this point.  He is aware of potential hypotension.  I will plan to see him back in 4 weeks for follow-up on hypertension and to discuss his lab results.  I have forwarded a copy of his calcium score CT to his PCP.  Incidentally found to have subcarnial lymph node calcification.  Will defer to her if this needs further evaluation.  Miquel Dunn, MSN, APRN, FNP-C [facility]. [place] Office: [phone] Fax: [phone]

## 2019-10-19 NOTE — Progress Notes (Deleted)
Primary Physician:  Camille Bal, PA-C   Patient ID: Harold Ayers, male    DOB: 12-Sep-1986, 34 y.o.   MRN: [id number]  Subjective:    No chief complaint on file.   HPI: Harold Ayers  is a 34 y.o. male  with history of seizure disorder, hypertension, anxiety, with ongoing chest pain and palpitations.  He wore 24-hour Holter monitor that showed rare PACs and PVCs.    Due to continued chest pain and hypertension, he underwent echocardiogram.  He was also scheduled for calcium score CT for further risk assessment; however, has not had this performed as of yet.  He now presents to discuss echocardiogram results.  Symptoms are unchanged since last seen by me.  His blood pressure has been better controlled with amlodipine.  He does question if potentially causing erectile dysfunction.  Patient reports that he has had continued chest pain since 2018; however, seems to be more severe over the last several weeks.  States that chest pain is there approximately 85% of the time.  No correlation between certain movements or exertion. Denies any shortness of breath. 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  Has family history of heart disease at age 48 in his father. No tobacco use.   Past Medical History:  Diagnosis Date  . Anxiety   . GERD (gastroesophageal reflux disease)   . Hypertension   . Seizures ([place])    pt reported last seizure was 5 years ago    Past Surgical History:  Procedure Laterality Date  . COLONOSCOPY    . NO PAST SURGERIES    . WISDOM TOOTH EXTRACTION      Social History   Socioeconomic History  . Marital status: Single    Spouse name: Not on file  . Number of children: 3  . Years of education: Not on file  . Highest education level: Not on file  Occupational History  . Occupation: [employer]  Tobacco Use  .  Smoking status: Never Smoker  . Smokeless tobacco: Never Used  Substance and Sexual Activity  . Alcohol use: Yes    Alcohol/week: 14.0 standard drinks    Types: 14 Cans of beer per week    Comment: 1 or 2 a day  . Drug use: No  . Sexual activity: Yes  Other Topics Concern  . Not on file  Social History Narrative  . Not on file   Social Determinants of Health   Financial Resource Strain:   . Difficulty of Paying Living Expenses: Not on file  Food Insecurity:   . Worried About Charity fundraiser in the Last Year: Not on file  . Ran Out of Food in the Last Year: Not on file  Transportation Needs:   . Lack of Transportation (Medical): Not on file  . Lack of Transportation (Non-Medical): Not on file  Physical Activity:   . Days of Exercise per Week: Not on file  . Minutes of Exercise per Session: Not on file  Stress:   . Feeling of Stress : Not on file  Social Connections:   . Frequency of Communication with Friends and Family: Not on file  . Frequency of Social Gatherings with Friends and Family: Not on file  . Attends Religious Services: Not on file  . Active Member of Clubs or Organizations: Not on  file  . Attends Archivist Meetings: Not on file  . Marital Status: Not on file  Intimate Partner Violence:   . Fear of Current or Ex-Partner: Not on file  . Emotionally Abused: Not on file  . Physically Abused: Not on file  . Sexually Abused: Not on file    Review of Systems  Constitution: Negative for decreased appetite, malaise/fatigue, weight gain and weight loss.  Eyes: Negative for visual disturbance.  Cardiovascular: Positive for chest pain. Negative for claudication, dyspnea on exertion, leg swelling, orthopnea, palpitations and syncope.  Respiratory: Negative for hemoptysis and wheezing.   Endocrine: Negative for cold intolerance and heat intolerance.  Hematologic/Lymphatic: Does not bruise/bleed easily.  Skin: Negative for nail changes.    Musculoskeletal: Negative for muscle weakness and myalgias.  Gastrointestinal: Negative for abdominal pain, change in bowel habit, nausea and vomiting.  Neurological: Negative for difficulty with concentration, dizziness, focal weakness and headaches.  Psychiatric/Behavioral: Negative for altered mental status and suicidal ideas.  All other systems reviewed and are negative.     Objective:  There were no vitals taken for this visit. There is no height or weight on file to calculate BMI.    Physical Exam  Constitutional: He is oriented to person, place, and time. Vital signs are normal. He appears well-developed and well-nourished.  HENT:  Head: Normocephalic and atraumatic.  Cardiovascular: Normal rate, regular rhythm, normal heart sounds and intact distal pulses.  Pulmonary/Chest: Effort normal and breath sounds normal. No accessory muscle usage. No respiratory distress.  Abdominal: Soft. Bowel sounds are normal.  Musculoskeletal:        General: Normal range of motion.     Cervical back: Normal range of motion.  Neurological: He is alert and oriented to person, place, and time.  Skin: Skin is warm and dry.  Vitals reviewed.  Radiology: No results found.  Laboratory examination:    CMP Latest Ref Rng & Units 04/27/2018 04/26/2017 11/19/2016  Glucose 70 - 99 mg/dL 67(L) 108(H) 96  BUN 6 - 20 mg/dL '13 14 10  ' Creatinine 0.61 - 1.24 mg/dL 1.25(H) 1.24 1.04  Sodium 135 - 145 mmol/L 136 137 136  Potassium 3.5 - 5.1 mmol/L 3.6 4.2 4.4  Chloride 98 - 111 mmol/L 98 103 98(L)  CO2 22 - 32 mmol/L '24 25 26  ' Calcium 8.9 - 10.3 mg/dL 9.9 9.3 9.8  Total Protein 6.5 - 8.1 g/dL - - -  Total Bilirubin 0.3 - 1.2 mg/dL - - -  Alkaline Phos 38 - 126 U/L - - -  AST 15 - 41 U/L - - -  ALT 17 - 63 U/L - - -   CBC Latest Ref Rng & Units 04/27/2018 04/26/2017 11/19/2016  WBC 4.0 - 10.5 K/uL 6.7 5.2 4.1  Hemoglobin 13.0 - 17.0 g/dL 14.2 13.2 14.1  Hematocrit 39.0 - 52.0 % 45.3 40.8 43.6  Platelets 150  - 400 K/uL 274 261 231   Lipid Panel  No results found for: CHOL, TRIG, HDL, CHOLHDL, VLDL, LDLCALC, LDLDIRECT HEMOGLOBIN A1C No results found for: HGBA1C, MPG TSH No results for input(s): TSH in the last 8760 hours.  PRN Meds:. There are no discontinued medications. No outpatient medications have been marked as taking for the 10/19/19 encounter (Appointment) with Miquel Dunn, NP.   Current Facility-Administered Medications for the 10/19/19 encounter (Appointment) with Miquel Dunn, NP  Medication  . 0.9 %  sodium chloride infusion    Cardiac Studies:   Echocardiogram 09/19/2019: Normal LV  systolic function with EF 55%. Moderate concentric hypertrophy of the left ventricle. Normal global wall motion. Left ventricle cavity is normal in size. Normal diastolic filling pattern.  Left atrial cavity is mildly dilated. 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 Mild to moderate mitral regurgitation. IVC is dilated with blunted respiratory response. This may suggest elevated right heart pressure.   Holter Monitor 24 hours 11/20/2016: Slowest heart rate at 11:22 AM, 53 bpm. Maximum heart rate 128 bpm at 7:44 AM. Rare ABCs and PACs. One episode of palpitations, symptomatic reveals normal sinus rhythm.  Treadmill exercise stress test 04/03/2017: Indication: CP Resting EKG demonstrates NSR. The patient exercised according to Bruce Protocol, Total time recorded 10:30 min achieving max heart rate of 194 which was 102% of THR for age and 12.59 METS of work. Stress terminated due to THR (>85% MPHR)/MPHR met. Hypertensive at baseline. Normal BP response. There was no ST-T changes of ischemia with exercise stress test. There were no significant arrhythmias.  Rec: No e/o ischemia by GXT. Exercise tolerence is excellent. Hypertensive at baseline. Normal BP response. Peak BP 184/101 mm Hg.Needs BP management. Continue Preventive  therapy.  Assessment:   No diagnosis found.  EKG 09/01/2019: Normal sinus rhythm at 74 bpm, normal axis, early repolarization abnormality. No evidence of ischemia.  Recommendations:   I have discussed recent echocardiogram results that shows normal LVEF.  I suspect moderate LVH and left atrial dilation related to uncontrolled hypertension.  His blood pressure is better controlled now with amlodipine.  He did have some slight improvement in chest pain with amlodipine, but continues to mostly still have the symptoms that are unchanged.  I recommended we continue with obtaining calcium score CT for further risk assessment.  I do suspect that his chest pain is of cardiac etiology as it has been present for several years. Hopefully if contributed by blood pressure, this will improve with better blood pressure control. I would recommend continued efforts of primary and secondary prevention. We have discussed dietary changes as well as regular exercise.   He has had one episode of ED that he felt may be related to amlodipine. I have discussed that this is not a usual side effect of amlodipine. His blood pressure has significantly improved with the medication. I have recommended that we watch and see if this continues. If so, we can potentially stop the medication to see if truly related. I will schedule him for a virtual visit in 2 weeks to discuss his calcium score CT and make further recommendations at that time.   Miquel Dunn, MSN, APRN, FNP-C [facility]. [place] Office: [phone] Fax: [phone]

## 2019-11-16 ENCOUNTER — Ambulatory Visit: Payer: Managed Care, Other (non HMO) | Admitting: Cardiology

## 2019-11-29 ENCOUNTER — Other Ambulatory Visit: Payer: Self-pay | Admitting: Cardiology

## 2019-12-24 ENCOUNTER — Other Ambulatory Visit: Payer: Self-pay | Admitting: Cardiology

## 2019-12-24 DIAGNOSIS — R0789 Other chest pain: Secondary | ICD-10-CM

## 2020-01-11 ENCOUNTER — Other Ambulatory Visit: Payer: Self-pay | Admitting: Cardiology

## 2020-01-11 DIAGNOSIS — E871 Hypo-osmolality and hyponatremia: Secondary | ICD-10-CM

## 2020-01-11 LAB — BASIC METABOLIC PANEL
BUN/Creatinine Ratio: 21 — ABNORMAL HIGH (ref 9–20)
BUN: 23 mg/dL — ABNORMAL HIGH (ref 6–20)
CO2: 20 mmol/L (ref 20–29)
Calcium: 10 mg/dL (ref 8.7–10.2)
Chloride: 92 mmol/L — ABNORMAL LOW (ref 96–106)
Creatinine, Ser: 1.11 mg/dL (ref 0.76–1.27)
GFR calc Af Amer: 100 mL/min/{1.73_m2} (ref >59)
GFR calc non Af Amer: 87 mL/min/{1.73_m2} (ref >59)
Glucose: 91 mg/dL (ref 65–99)
Potassium: 4.9 mmol/L (ref 3.5–5.2)
Sodium: 128 mmol/L — ABNORMAL LOW (ref 134–144)

## 2020-01-11 NOTE — Progress Notes (Signed)
Patient was started on Chlorthalidone for hypertension and was supposed to get this lab in Jan, but he got this now. I would recommend discontinue Chlorthalidone as his sodium levels have dropped severely and will recheck labs in 2 weeks and change his appointment with Dr. Odis Hollingshead 1-2 days after lab. No fasting is required.  JG

## 2020-01-12 ENCOUNTER — Telehealth: Payer: Self-pay

## 2020-01-12 NOTE — Telephone Encounter (Signed)
LVMTCB

## 2020-01-12 NOTE — Telephone Encounter (Signed)
-----   Message from Yates Decamp, MD sent at 01/11/2020  6:31 AM EDT ----- Patient was started on Chlorthalidone for hypertension and was supposed to get this lab in Jan, but he got this now. I would recommend discontinue Chlorthalidone as his sodium levels have dropped severely and will recheck labs in 2 weeks and change his appointment with Dr. Odis Hollingshead 1-2 days after lab. No fasting is required.  JG

## 2020-01-18 ENCOUNTER — Ambulatory Visit: Payer: Managed Care, Other (non HMO) | Admitting: Cardiology

## 2020-01-31 ENCOUNTER — Ambulatory Visit: Payer: Managed Care, Other (non HMO) | Admitting: Cardiology

## 2020-02-01 ENCOUNTER — Other Ambulatory Visit: Payer: Self-pay

## 2020-02-01 MED ORDER — LISINOPRIL 40 MG PO TABS: 40.0000 mg | ORAL_TABLET | Freq: Every day | ORAL | 0 refills | Status: DC

## 2020-02-06 ENCOUNTER — Other Ambulatory Visit: Payer: Self-pay

## 2020-02-06 DIAGNOSIS — R0789 Other chest pain: Secondary | ICD-10-CM

## 2020-02-06 MED ORDER — AMLODIPINE BESYLATE 5 MG PO TABS: 5.0000 mg | ORAL_TABLET | Freq: Every day | ORAL | 2 refills | Status: DC

## 2020-02-11 LAB — BASIC METABOLIC PANEL
BUN/Creatinine Ratio: 11 (ref 9–20)
BUN: 13 mg/dL (ref 6–20)
CO2: 21 mmol/L (ref 20–29)
Calcium: 9.8 mg/dL (ref 8.7–10.2)
Chloride: 100 mmol/L (ref 96–106)
Creatinine, Ser: 1.14 mg/dL (ref 0.76–1.27)
GFR calc Af Amer: 97 mL/min/{1.73_m2} (ref >59)
GFR calc non Af Amer: 84 mL/min/{1.73_m2} (ref >59)
Glucose: 80 mg/dL (ref 65–99)
Potassium: 4.7 mmol/L (ref 3.5–5.2)
Sodium: 138 mmol/L (ref 134–144)

## 2020-02-14 ENCOUNTER — Ambulatory Visit: Payer: Managed Care, Other (non HMO) | Admitting: Cardiology

## 2020-02-14 ENCOUNTER — Other Ambulatory Visit: Payer: Self-pay

## 2020-02-14 ENCOUNTER — Encounter: Payer: Self-pay | Admitting: Cardiology

## 2020-02-14 VITALS — BP 136/99 | HR 83 | Ht 75.0 in | Wt 256.0 lb

## 2020-02-14 DIAGNOSIS — E871 Hypo-osmolality and hyponatremia: Secondary | ICD-10-CM

## 2020-02-14 DIAGNOSIS — Z6832 Body mass index (BMI) 32.0-32.9, adult: Secondary | ICD-10-CM

## 2020-02-14 DIAGNOSIS — Z8249 Family history of ischemic heart disease and other diseases of the circulatory system: Secondary | ICD-10-CM

## 2020-02-14 DIAGNOSIS — I10 Essential (primary) hypertension: Secondary | ICD-10-CM

## 2020-02-14 MED ORDER — LISINOPRIL 40 MG PO TABS: 40.0000 mg | ORAL_TABLET | Freq: Every day | ORAL | 0 refills | Status: AC

## 2020-02-14 MED ORDER — AMLODIPINE BESYLATE 10 MG PO TABS: 10.0000 mg | ORAL_TABLET | Freq: Every day | ORAL | 0 refills | Status: AC

## 2020-02-14 NOTE — Progress Notes (Signed)
Harold Ayers Date of Birth: 12/03/1985 MRN: [id number] Primary Care Provider:Wright, Faylene Million, PA-C (Inactive) Former Cardiology Providers: Jeri Lager, APRN, FNP-C, Dr. Adrian Prows Primary Cardiologist: Rex Kras, DO, [facility] (established care 02/14/2020)  Date: 02/14/20 Last Visit: 10/19/2019  Chief Complaint  Patient presents with  . Hypertension    follow up tests  . Hyponatremia    HPI  Harold Ayers is a 34 y.o.  male who presents to the office with a chief complaint of " review lab results and blood pressure management." Patient's past medical history and cardiovascular risk factors include: Seizure disorder, hypertension, anxiety, history of PACs/PVCs, family history of premature coronary artery disease.  At last office visit patient was started on chlorthalidone due to elevated blood pressures.  He was asked to have blood work done in 2 weeks to evaluate kidney function and electrolytes.  However, patient was noncompliant and had blood work done in April 2021.  Labs in April 2021 noted hyponatremia most likely secondary to diuretic induced.  He was asked to hold chlorthalidone and to have repeat blood work done.  He again delayed his repeat blood work until Feb 10, 2020.  He is here for medication refill and to review lab work.  His hyponatremia has resolved his most recent serum sodium level is 138.  He is asking for a refill on lisinopril and amlodipine.  Patient does not check his blood pressures regularly at home.  Patient educated on importance of a low-salt diet.  Patient does snore at night and given his underlying obesity would recommend sleep evaluation as he has high blood pressure since a relatively young age.  Patient may also benefit from secondary work-up of hypertension which can be started at the next office visit.  Family history of premature coronary artery disease in the family, dad was diagnosed with heart disease in his 67s.  ALLERGIES: No Known  Allergies   MEDICATION LIST PRIOR TO VISIT: Current Outpatient Medications on File Prior to Visit  Medication Sig Dispense Refill  . sildenafil (VIAGRA) 25 MG tablet Take 1 tablet (25 mg total) by mouth daily as needed for erectile dysfunction. 10 tablet 1   Current Facility-Administered Medications on File Prior to Visit  Medication Dose Route Frequency Provider Last Rate Last Admin  . 0.9 %  sodium chloride infusion  500 mL Intravenous Continuous Nandigam, Venia Minks, MD        PAST MEDICAL HISTORY: Past Medical History:  Diagnosis Date  . Anxiety   . GERD (gastroesophageal reflux disease)   . Hypertension   . Seizures ([place])    pt reported last seizure was 5 years ago    PAST SURGICAL HISTORY: Past Surgical History:  Procedure Laterality Date  . COLONOSCOPY    . NO PAST SURGERIES    . WISDOM TOOTH EXTRACTION      FAMILY HISTORY: The patient's family history includes Diabetes in his father, paternal grandfather, paternal grandmother, and paternal uncle; Drug abuse in his father; Healthy in his brother, brother, and sister; Heart attack in his father and maternal grandmother; Heart disease in his maternal grandmother; Hypertension in his mother; Kidney disease in his paternal uncle.   SOCIAL HISTORY:  The patient  reports that he has never smoked. He has never used smokeless tobacco. He reports current alcohol use of about 14.0 standard drinks of alcohol per week. He reports that he does not use drugs.  Review of Systems  Constitution: Negative for chills and fever.  HENT: Negative for hoarse  voice and nosebleeds.   Eyes: Negative for discharge, double vision and pain.  Cardiovascular: Negative for chest pain, claudication, dyspnea on exertion, leg swelling, near-syncope, orthopnea, palpitations, paroxysmal nocturnal dyspnea and syncope.  Respiratory: Negative for hemoptysis and shortness of breath.   Musculoskeletal: Negative for muscle cramps and myalgias.   Gastrointestinal: Negative for abdominal pain, constipation, diarrhea, hematemesis, hematochezia, melena, nausea and vomiting.  Neurological: Negative for dizziness and light-headedness.    PHYSICAL EXAM: Vitals with BMI 02/14/2020 09/29/2019 09/01/2019  Height '6\' 3"'  '6\' 3"'  '6\' 3"'   Weight 256 lbs 251 lbs 10 oz 252 lbs 14 oz  BMI 32 89.21 19.41  Systolic 740 814 481  Diastolic 99 88 856  Pulse 83 85 92    CONSTITUTIONAL: Well-developed and well-nourished. No acute distress.  SKIN: Skin is warm and dry. No rash noted. No cyanosis. No pallor. No jaundice HEAD: Normocephalic and atraumatic.  EYES: No scleral icterus MOUTH/THROAT: Moist oral membranes.  NECK: No JVD present. No thyromegaly noted. No carotid bruits  LYMPHATIC: No visible cervical adenopathy.  CHEST Normal respiratory effort. No intercostal retractions  LUNGS: Clear to auscultation bilaterally.  No stridor. No wheezes. No rales.  CARDIOVASCULAR: Regular rate and rhythm, positive S1-S2, no murmurs rubs or gallops appreciated. ABDOMINAL: Obese, soft, nontender, nondistended, positive bowel sounds in all 4 quadrants.  No apparent ascites.  EXTREMITIES: No peripheral edema  HEMATOLOGIC: No significant bruising NEUROLOGIC: Oriented to person, place, and time. Nonfocal. Normal muscle tone.  PSYCHIATRIC: Normal mood and affect. Normal behavior. Cooperative  CARDIAC DATABASE: EKG: EKG 09/01/2019: Normal sinus rhythm at 74 bpm, normal axis, early repolarization abnormality. No evidence of ischemia.  Echocardiogram: 09/19/2019: Normal LV systolic function with EF 55%. Moderate concentric hypertrophy of the left ventricle. Normal global wall motion. Left ventricle cavity is normal in size. Normal diastolic filling pattern.  Left atrial cavity is mildly dilated. 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 Mild to moderate mitral regurgitation.  Stress Testing:  Treadmill exercise stress  test 04/03/2017: Indication: CP Resting EKG demonstrates NSR. The patient exercised according to Bruce Protocol, Total time recorded 10:30 min achieving max heart rate of 194 which was 102% of THR for age and 12.59 METS of work. Stress terminated due to THR (>85% MPHR)/MPHR met. Hypertensive at baseline. Normal BP response. There was no ST-T changes of ischemia with exercise stress test. There were no significant arrhythmias.  Rec: No e/o ischemia by GXT. Exercise tolerence is excellent. Hypertensive at baseline. Normal BP response. Peak BP 184/101 mm Hg.Needs BP management. Continue Preventive therapy.  Heart Catheterization: None  Carotid duplex: None  Holter Monitor 24 hours 11/20/2016: Slowest heart rate at 11:22 AM, 53 bpm. Maximum heart rate 128 bpm at 7:44 AM. Rare PVCs and PACs. One episode of palpitations, symptomatic reveals normal sinus rhythm.  LABORATORY DATA: CBC Latest Ref Rng & Units 04/27/2018 04/26/2017 11/19/2016  WBC 4.0 - 10.5 K/uL 6.7 5.2 4.1  Hemoglobin 13.0 - 17.0 g/dL 14.2 13.2 14.1  Hematocrit 39.0 - 52.0 % 45.3 40.8 43.6  Platelets 150 - 400 K/uL 274 261 231    CMP Latest Ref Rng & Units 02/10/2020 01/10/2020 04/27/2018  Glucose 65 - 99 mg/dL 80 91 67(L)  BUN 6 - 20 mg/dL 13 23(H) 13  Creatinine 0.76 - 1.27 mg/dL 1.14 1.11 1.25(H)  Sodium 134 - 144 mmol/L 138 128(L) 136  Potassium 3.5 - 5.2 mmol/L 4.7 4.9 3.6  Chloride 96 - 106 mmol/L 100 92(L) 98  CO2  20 - 29 mmol/L '21 20 24  ' Calcium 8.7 - 10.2 mg/dL 9.8 10.0 9.9  Total Protein 6.5 - 8.1 g/dL - - -  Total Bilirubin 0.3 - 1.2 mg/dL - - -  Alkaline Phos 38 - 126 U/L - - -  AST 15 - 41 U/L - - -  ALT 17 - 63 U/L - - -    Lipid Panel  No results found for: CHOL, TRIG, HDL, CHOLHDL, VLDL, LDLCALC, LDLDIRECT, LABVLDL  No results found for: HGBA1C No components found for: NTPROBNP No results found for: TSH  Cardiac Panel (last 3 results) No results for input(s): CKTOTAL, CKMB, TROPONINIHS, RELINDX in  the last 72 hours.  IMPRESSION:    ICD-10-CM   1. Hyponatremia  E87.1   2. Benign hypertension  I10 amLODipine (NORVASC) 10 MG tablet    lisinopril (ZESTRIL) 40 MG tablet  3. Family history of early CAD  Z45.49   23. Class 1 obesity due to excess calories without serious comorbidity with body mass index (BMI) of 32.0 to 32.9 in adult  E66.09    Z68.32      RECOMMENDATIONS: Harold Ayers is a 34 y.o. male whose past medical history and cardiovascular risk factors include: Hypertension, family history of premature coronary disease, obesity, seizure disorder.  Hypertension:  Patient's blood pressure is not currently at goal but improving.  Refill lisinopril.  Refilled/increased amlodipine to 10 mg p.o. daily  Most recent blood work reviewed.  Low-salt diet recommended.  Also recommended DASH diet and increasing physical activity 30 minutes a day 5 days a week.  Recommended sleep evaluation to screen for obstructive sleep apnea, will follow with primary team.  Family history of premature coronary artery disease:  In the past patient has been evaluated for chest pain with coronary artery calcification score, echocardiogram, and a GXT.  Findings noted above for further reference.  Educated on importance of primary prevention with improving his modifiable cardiovascular risk factors.  Obesity, due to excess calories: . Body mass index is 32 kg/m. . I reviewed with the patient the importance of diet, regular physical activity/exercise, weight loss.   . Patient is educated on increasing physical activity gradually as tolerated.  With the goal of moderate intensity exercise for 30 minutes a day 5 days a week.  FINAL MEDICATION LIST END OF ENCOUNTER: Meds ordered this encounter  Medications  . amLODipine (NORVASC) 10 MG tablet    Sig: Take 1 tablet (10 mg total) by mouth daily.    Dispense:  90 tablet    Refill:  0  . lisinopril (ZESTRIL) 40 MG tablet    Sig: Take 1 tablet (40  mg total) by mouth daily.    Dispense:  90 tablet    Refill:  0    Pt must keep pending appt for further refills due to cx/no shows. Only allowing #15    Medications Discontinued During This Encounter  Medication Reason  . omeprazole (PRILOSEC) 40 MG capsule Completed Course  . amLODipine (NORVASC) 5 MG tablet Dose change  . lisinopril (ZESTRIL) 40 MG tablet Reorder     Current Outpatient Medications:  .  amLODipine (NORVASC) 10 MG tablet, Take 1 tablet (10 mg total) by mouth daily., Disp: 90 tablet, Rfl: 0 .  lisinopril (ZESTRIL) 40 MG tablet, Take 1 tablet (40 mg total) by mouth daily., Disp: 90 tablet, Rfl: 0 .  sildenafil (VIAGRA) 25 MG tablet, Take 1 tablet (25 mg total) by mouth daily as needed for erectile dysfunction.,  Disp: 10 tablet, Rfl: 1  Current Facility-Administered Medications:  .  0.9 %  sodium chloride infusion, 500 mL, Intravenous, Continuous, Nandigam, Kavitha V, MD  No orders of the defined types were placed in this encounter.  --Continue cardiac medications as reconciled in final medication list. --Return in about 3 months (around 05/16/2020) for BP follow up.. Or sooner if needed. --Continue follow-up with your primary care physician regarding the management of your other chronic comorbid conditions.  Patient's questions and concerns were addressed to his satisfaction. He voices understanding of the instructions provided during this encounter.   This note was created using a voice recognition software as a result there may be grammatical errors inadvertently enclosed that do not reflect the nature of this encounter. Every attempt is made to correct such errors.  Rex Kras, [place], [facility]  Pager: [phone] Office: [phone]

## 2020-05-16 ENCOUNTER — Ambulatory Visit: Payer: Managed Care, Other (non HMO) | Admitting: Cardiology

## 2020-10-20 ENCOUNTER — Other Ambulatory Visit: Payer: Self-pay | Admitting: Cardiology

## 2022-02-22 ENCOUNTER — Emergency Department (HOSPITAL_COMMUNITY)
Admission: EM | Admit: 2022-02-22 | Discharge: 2022-02-22 | Disposition: A | Discharge status: Home / Self Care | Payer: No Typology Code available for payment source | Attending: Emergency Medicine | Admitting: Emergency Medicine

## 2022-02-22 ENCOUNTER — Emergency Department (HOSPITAL_COMMUNITY): Payer: No Typology Code available for payment source

## 2022-02-22 ENCOUNTER — Encounter (HOSPITAL_COMMUNITY): Payer: Self-pay

## 2022-02-22 ENCOUNTER — Other Ambulatory Visit: Payer: Self-pay

## 2022-02-22 DIAGNOSIS — I10 Essential (primary) hypertension: Secondary | ICD-10-CM | POA: Diagnosis not present

## 2022-02-22 DIAGNOSIS — Z79899 Other long term (current) drug therapy: Secondary | ICD-10-CM | POA: Insufficient documentation

## 2022-02-22 DIAGNOSIS — Y92410 Unspecified street and highway as the place of occurrence of the external cause: Secondary | ICD-10-CM | POA: Diagnosis not present

## 2022-02-22 DIAGNOSIS — R519 Headache, unspecified: Secondary | ICD-10-CM | POA: Diagnosis not present

## 2022-02-22 DIAGNOSIS — M25512 Pain in left shoulder: Secondary | ICD-10-CM | POA: Insufficient documentation

## 2022-02-22 DIAGNOSIS — S4992XA Unspecified injury of left shoulder and upper arm, initial encounter: Secondary | ICD-10-CM | POA: Insufficient documentation

## 2022-02-22 MED ORDER — IBUPROFEN 600 MG PO TABS: 600.0000 mg | ORAL_TABLET | Freq: Four times a day (QID) | ORAL | 0 refills | Status: AC | PRN

## 2022-02-22 MED ORDER — CYCLOBENZAPRINE HCL 10 MG PO TABS: 10.0000 mg | ORAL_TABLET | Freq: Two times a day (BID) | ORAL | 0 refills | Status: AC | PRN
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
## 2022-02-22 NOTE — ED Notes (Signed)
Received verbal report from [place] G RN at this time ?

## 2022-02-22 NOTE — ED Notes (Signed)
Returned from ct ortho at bedside

## 2022-02-22 NOTE — Discharge Instructions (Addendum)
Your left shoulder injury may be related to several conditions, including an injury to your rotator cuff, or separation of your [facility] joint.  I recommend that you wear the sling at all times except when showering.  If you continue having pain and limited mobility in your shoulder after 48 hours, please call to schedule a follow-up appointment with the orthopedic doctor at the number listed above.  You should not lift anything heavy more than 10 pounds with your left hand.

## 2022-02-22 NOTE — ED Notes (Signed)
Provider at bedside at this time
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
## 2022-02-22 NOTE — ED Notes (Signed)
Per provider pt can have something to drink. Provided a cup of water at this time. Ortho called at this time as well

## 2022-02-22 NOTE — Progress Notes (Signed)
Orthopedic Tech Progress Note Patient Details:  Harold Ayers 11-Dec-1985 [id number]  Ortho Devices Type of Ortho Device: Arm sling Ortho Device/Splint Location: lle Ortho Device/Splint Interventions: Ordered, Application, Adjustment   Post Interventions Patient Tolerated: Well Instructions Provided: Care of device, Adjustment of device  [name] Post 02/22/2022, 8:46 PM

## 2022-02-22 NOTE — ED Notes (Signed)
Pt transported to ct at this time 

## 2023-04-15 ENCOUNTER — Emergency Department (HOSPITAL_BASED_OUTPATIENT_CLINIC_OR_DEPARTMENT_OTHER): Payer: 59

## 2023-04-15 ENCOUNTER — Emergency Department (HOSPITAL_BASED_OUTPATIENT_CLINIC_OR_DEPARTMENT_OTHER)
Admission: EM | Admit: 2023-04-15 | Discharge: 2023-04-15 | Disposition: A | Discharge status: Home / Self Care | Payer: 59 | Attending: Emergency Medicine | Admitting: Emergency Medicine

## 2023-04-15 ENCOUNTER — Encounter (HOSPITAL_BASED_OUTPATIENT_CLINIC_OR_DEPARTMENT_OTHER): Payer: Self-pay

## 2023-04-15 DIAGNOSIS — X509XXA Other and unspecified overexertion or strenuous movements or postures, initial encounter: Secondary | ICD-10-CM | POA: Insufficient documentation

## 2023-04-15 DIAGNOSIS — Y9367 Activity, basketball: Secondary | ICD-10-CM | POA: Insufficient documentation

## 2023-04-15 DIAGNOSIS — S86912A Strain of unspecified muscle(s) and tendon(s) at lower leg level, left leg, initial encounter: Secondary | ICD-10-CM | POA: Diagnosis not present

## 2023-04-15 DIAGNOSIS — M79605 Pain in left leg: Secondary | ICD-10-CM | POA: Diagnosis present

## 2023-04-15 DIAGNOSIS — S86812A Strain of other muscle(s) and tendon(s) at lower leg level, left leg, initial encounter: Secondary | ICD-10-CM

## 2023-04-15 MED ORDER — KETOROLAC TROMETHAMINE 30 MG/ML IJ SOLN: 15.0000 mg | Freq: Once | INTRAMUSCULAR | Status: DC

## 2023-04-15 MED ORDER — IBUPROFEN 800 MG PO TABS
800.0000 mg | ORAL_TABLET | Freq: Once | ORAL | Status: AC
  Administered 2023-04-15: 800 mg via ORAL
  Filled 2023-04-15: qty 1

## 2023-04-15 NOTE — ED Provider Notes (Signed)
[facility] AT [facility] Provider Note   CSN: [id number] Arrival date & time: 04/15/23  1655     History  Chief Complaint  Patient presents with   Leg Injury    Harold Ayers is a 37 y.o. male,  Medical history, who presents to the ED secondary to left calf pain has been going on for the last few hours.  He states he was playing basketball with his children, and he landed on his foot, and started having sudden Pain.  Denies any ankle or foot pain, but states it hurts to walk.   Home Medications Prior to Admission medications   Medication Sig Start Date End Date Taking? Authorizing Provider  amLODipine (NORVASC) 10 MG tablet Take 1 tablet (10 mg total) by mouth daily. 02/14/20 05/14/20  Tolia, Sunit, DO  cyclobenzaprine (FLEXERIL) 10 MG tablet Take 1 tablet (10 mg total) by mouth 2 (two) times daily as needed for up to 15 doses for muscle spasms. 02/22/22   Terald Sleeper, MD  ibuprofen (ADVIL) 600 MG tablet Take 1 tablet (600 mg total) by mouth every 6 (six) hours as needed for up to 30 doses for moderate pain or mild pain. 02/22/22   Terald Sleeper, MD  lisinopril (ZESTRIL) 40 MG tablet Take 1 tablet (40 mg total) by mouth daily. 02/14/20 05/14/20  Tolia, Sunit, DO  sildenafil (VIAGRA) 25 MG tablet Take 1 tablet (25 mg total) by mouth daily as needed for erectile dysfunction. 10/19/19   Toniann Fail, NP      Allergies    Patient has no known allergies.    Review of Systems   Review of Systems  Physical Exam Updated Vital Signs BP (!) 151/100 (BP Location: Left Arm)   Pulse 98   Temp 98.8 F (37.1 C)   Resp 18   Ht 6\' 3"  (1.905 m)   Wt 124.7 kg   SpO2 97%   BMI 34.37 kg/m  Physical Exam Vitals and nursing note reviewed.  Constitutional:      General: He is not in acute distress.    Appearance: He is well-developed.  HENT:     Head: Normocephalic and atraumatic.  Eyes:     General:        Right eye: No discharge.         Left eye: No discharge.     Conjunctiva/sclera: Conjunctivae normal.  Pulmonary:     Effort: No respiratory distress.  Musculoskeletal:     Comments: Tenderness to palpation of left gastrocnemius, along midline of posterior calf, without erythema, edema, or warmth.  No rash noted.  He is able to flex and extend the knee appropriately.  No tenderness to palpation along high ankle, or ankle, with no step-offs noted.  No foot tenderness.  Positive dorsalis pedis pulse.  Negative Thompson test.  Neurological:     Mental Status: He is alert.     Comments: Clear speech.   Psychiatric:        Behavior: Behavior normal.        Thought Content: Thought content normal.     ED Results / Procedures / Treatments   Labs (all labs ordered are listed, but only abnormal results are displayed) Labs Reviewed - No data to display  EKG None  Radiology DG Tibia/Fibula Left  Result Date: 04/15/2023 CLINICAL DATA:  Acute injury in the calf region. EXAM: LEFT TIBIA AND FIBULA - 2 VIEW COMPARISON:  None Available. FINDINGS: No abnormality of  the tibia or fibula. Soft tissues of the calf appear unremarkable by radiography. IMPRESSION: Normal radiographs. Electronically Signed   By: Paulina Fusi M.D.   On: 04/15/2023 18:07   DG Knee Complete 4 Views Left  Result Date: 04/15/2023 CLINICAL DATA:  Injury to the lower leg. EXAM: LEFT KNEE - COMPLETE 4+ VIEW COMPARISON:  None Available. FINDINGS: No evidence of fracture, dislocation, or joint effusion. No evidence of arthropathy or other focal bone abnormality. Soft tissues are unremarkable. IMPRESSION: Normal radiographs of the left knee. Electronically Signed   By: Paulina Fusi M.D.   On: 04/15/2023 18:06    Procedures Procedures    Medications Ordered in ED Medications  ibuprofen (ADVIL) tablet 800 mg (800 mg Oral Given 04/15/23 1824)    ED Course/ Medical Decision Making/ A&P                             Medical Decision Making Patient has tenderness  to palpation of the posterior calf, no swelling, bruising, able to raise leg.  Negative for any kind of ankle or foot tenderness.  Difficult time bearing weight.  Her to snap when playing basketball after landing hard.  He has a negative Thompson's test, good dorsalis pedis pulse, no acute deficits on exam.  He will obtain x-rays to further evaluate.  Amount and/or Complexity of Data Reviewed Radiology: ordered.    Details: X-rays are unremarkable and negative for any acute fractures Discussion of management or test interpretation with external provider(s): I am suspicious that the patient probably has a gastroc tear, from plantar flexing, he has no ankle tenderness, to suggest an Achilles tendon tear, or high ankle taint pain.  He has negative Thompson test, but is able to raise leg completely.  Range of motion is intact.  We will put him in crutches, have him follow-up with orthopedics.  We discussed return precautions and voiced understanding  Risk Prescription drug management.   Final Clinical Impression(s) / ED Diagnoses Final diagnoses:  Strain of calf muscle, left, initial encounter    Rx / DC Orders ED Discharge Orders     None         Latese Dufault, [place]ley [place], PA 04/15/23 1909    Glyn Ade, MD 04/15/23 2205

## 2023-04-15 NOTE — Discharge Instructions (Addendum)
Please follow-up with your primary care doctor and orthopedics, return if your symptoms are worsening.  Weight-bear as tolerated.  Return to the ER if you have any like loss of pulse to the your foot, coolness, or extreme swelling to your extremity.

## 2023-04-15 NOTE — ED Triage Notes (Signed)
Pt c/o left leg pain. Pt reports he was playing basketball with his kids, felt something pop in left calf about an hour ago.
